# Patient Record
Sex: Male | Born: 1950 | Race: White | Hispanic: No | Marital: Married | Smoking: Former smoker
Health system: Southern US, Community
[De-identification: ages and names within clinical notes are randomized; demographics above are authoritative.]

## PROBLEM LIST (undated history)

## (undated) DIAGNOSIS — F419 Anxiety disorder, unspecified: Secondary | ICD-10-CM

## (undated) DIAGNOSIS — I2510 Atherosclerotic heart disease of native coronary artery without angina pectoris: Secondary | ICD-10-CM

## (undated) DIAGNOSIS — Z950 Presence of cardiac pacemaker: Secondary | ICD-10-CM

## (undated) DIAGNOSIS — K219 Gastro-esophageal reflux disease without esophagitis: Secondary | ICD-10-CM

## (undated) DIAGNOSIS — R7303 Prediabetes: Secondary | ICD-10-CM

## (undated) DIAGNOSIS — J45909 Unspecified asthma, uncomplicated: Secondary | ICD-10-CM

## (undated) DIAGNOSIS — C801 Malignant (primary) neoplasm, unspecified: Secondary | ICD-10-CM

## (undated) DIAGNOSIS — F32A Depression, unspecified: Secondary | ICD-10-CM

## (undated) DIAGNOSIS — I10 Essential (primary) hypertension: Secondary | ICD-10-CM

## (undated) DIAGNOSIS — R011 Cardiac murmur, unspecified: Secondary | ICD-10-CM

## (undated) DIAGNOSIS — I38 Endocarditis, valve unspecified: Secondary | ICD-10-CM

## (undated) DIAGNOSIS — I719 Aortic aneurysm of unspecified site, without rupture: Secondary | ICD-10-CM

## (undated) DIAGNOSIS — M1990 Unspecified osteoarthritis, unspecified site: Secondary | ICD-10-CM

## (undated) HISTORY — PX: VASECTOMY: SHX75

## (undated) HISTORY — PX: BLADDER SUSPENSION: SHX72

## (undated) HISTORY — PX: CORONARY ARTERY BYPASS GRAFT: SHX141

## (undated) HISTORY — PX: COLONOSCOPY: SHX174

## (undated) HISTORY — PX: AORTIC VALVE REPLACEMENT: SHX41

## (undated) HISTORY — PX: SPINAL FUSION: SHX223

## (undated) HISTORY — PX: CIRCUMCISION: SUR203

## (undated) HISTORY — PX: MEDIASTERNOTOMY: SHX5084

## (undated) HISTORY — PX: CARDIAC CATHETERIZATION: SHX172

## (undated) HISTORY — PX: CARDIAC SURGERY: SHX584

## (undated) HISTORY — PX: MULTIPLE TOOTH EXTRACTIONS: SHX2053

## (undated) HISTORY — PX: KNEE ARTHROSCOPY: SUR90

## (undated) HISTORY — PX: TONSILLECTOMY: SUR1361

---

## 2001-09-06 DIAGNOSIS — C801 Malignant (primary) neoplasm, unspecified: Secondary | ICD-10-CM

## 2001-09-06 HISTORY — PX: PROSTATECTOMY: SHX69

## 2001-09-06 HISTORY — DX: Malignant (primary) neoplasm, unspecified: C80.1

## 2006-06-29 DIAGNOSIS — Q2543 Congenital aneurysm of aorta: Secondary | ICD-10-CM | POA: Insufficient documentation

## 2010-11-05 DIAGNOSIS — I38 Endocarditis, valve unspecified: Secondary | ICD-10-CM

## 2010-11-05 HISTORY — DX: Endocarditis, valve unspecified: I38

## 2013-02-12 DIAGNOSIS — I10 Essential (primary) hypertension: Secondary | ICD-10-CM | POA: Insufficient documentation

## 2013-07-23 HISTORY — PX: URETHRAL SLING: SHX2621

## 2013-10-17 DIAGNOSIS — C61 Malignant neoplasm of prostate: Secondary | ICD-10-CM | POA: Insufficient documentation

## 2014-11-04 DIAGNOSIS — E663 Overweight: Secondary | ICD-10-CM | POA: Insufficient documentation

## 2014-11-04 DIAGNOSIS — M25551 Pain in right hip: Secondary | ICD-10-CM | POA: Insufficient documentation

## 2014-11-07 DIAGNOSIS — G720 Drug-induced myopathy: Secondary | ICD-10-CM | POA: Insufficient documentation

## 2016-02-10 DIAGNOSIS — R42 Dizziness and giddiness: Secondary | ICD-10-CM | POA: Insufficient documentation

## 2016-07-04 ENCOUNTER — Encounter: Payer: Self-pay | Admitting: Emergency Medicine

## 2016-07-04 ENCOUNTER — Emergency Department
Admission: EM | Admit: 2016-07-04 | Discharge: 2016-07-04 | Disposition: A | Discharge status: Home / Self Care | Payer: BLUE CROSS/BLUE SHIELD | Source: Home / Self Care | Attending: Family Medicine | Admitting: Family Medicine

## 2016-07-04 DIAGNOSIS — R319 Hematuria, unspecified: Secondary | ICD-10-CM

## 2016-07-04 DIAGNOSIS — N390 Urinary tract infection, site not specified: Secondary | ICD-10-CM

## 2016-07-04 HISTORY — DX: Atherosclerotic heart disease of native coronary artery without angina pectoris: I25.10

## 2016-07-04 HISTORY — DX: Malignant (primary) neoplasm, unspecified: C80.1

## 2016-07-04 HISTORY — DX: Essential (primary) hypertension: I10

## 2016-07-04 LAB — POCT URINALYSIS DIP (MANUAL ENTRY)
BILIRUBIN UA: NEGATIVE
Bilirubin, UA: NEGATIVE
Glucose, UA: NEGATIVE
NITRITE UA: NEGATIVE
PH UA: 6 (ref 5–8)
Spec Grav, UA: 1.015 (ref 1.005–1.03)
UROBILINOGEN UA: 0.2 (ref 0–1)

## 2016-07-04 MED ORDER — CIPROFLOXACIN HCL 500 MG PO TABS: 500.0000 mg | ORAL_TABLET | Freq: Two times a day (BID) | ORAL | 0 refills | Status: DC

## 2016-07-04 NOTE — ED Provider Notes (Signed)
[facility]    CSN: [id number] Arrival date & time: 07/04/16  1705     History   Chief Complaint Chief Complaint  Patient presents with  . Dysuria  . Hematuria    HPI Marcus Harper is a 65 y.o. male.   Patient complains of onset of dysuria and hematuria yesterday.  No abdominal, back, or flank pain.  No fevers, chills, and sweats.  No testicular pain.  No urethral discharge. He has a history of prostate CA.   The history is provided by the patient.  Dysuria  This is a new problem. The current episode started yesterday. The problem occurs constantly. The problem has been gradually worsening. Pertinent negatives include no abdominal pain. Nothing aggravates the symptoms. Nothing relieves the symptoms. He has tried nothing for the symptoms.  Hematuria  Pertinent negatives include no abdominal pain.    Past Medical History:  Diagnosis Date  . Cancer [facility]    prostate  . Coronary artery disease    aortic valve  . Hypertension     There are no active problems to display for this patient.   Past Surgical History:  Procedure Laterality Date  . CARDIAC SURGERY    . PROSTATECTOMY         Home Medications    Prior to Admission medications   Medication Sig Start Date End Date Taking? Authorizing Provider  amLODipine (NORVASC) 5 MG tablet Take 5 mg by mouth daily.   Yes Historical Provider, MD  aspirin 325 MG EC tablet Take 325 mg by mouth daily.   Yes Historical Provider, MD  atorvastatin (LIPITOR) 40 MG tablet Take 40 mg by mouth daily.   Yes Historical Provider, MD  hydrochlorothiazide (HYDRODIURIL) 25 MG tablet Take 25 mg by mouth daily.   Yes Historical Provider, MD  lisinopril (PRINIVIL,ZESTRIL) 20 MG tablet Take 20 mg by mouth daily.   Yes Historical Provider, MD  metoprolol succinate (TOPROL-XL) 25 MG 24 hr tablet Take 25 mg by mouth daily.   Yes Historical Provider, MD  oxybutynin (DITROPAN-XL) 10 MG 24 hr tablet Take 10 mg by mouth at bedtime.    Yes Historical Provider, MD  ciprofloxacin (CIPRO) 500 MG tablet Take 1 tablet (500 mg total) by mouth 2 (two) times daily. 07/04/16   Kandra Nicolas, MD    Family History Family History  Problem Relation Age of Onset  . Heart failure Mother   . Cancer Father     Social History Social History  Substance Use Topics  . Smoking status: Former Research scientist (life sciences)  . Smokeless tobacco: Not on file  . Alcohol use Yes     Allergies   Penicillins   Review of Systems Review of Systems  Constitutional: Negative for activity change, appetite change, chills, diaphoresis, fatigue and fever.  Gastrointestinal: Negative for abdominal pain.  Genitourinary: Positive for dysuria and hematuria.  All other systems reviewed and are negative.    Physical Exam Triage Vital Signs ED Triage Vitals  Enc Vitals Group     BP 07/04/16 1743 131/66     Pulse Rate 07/04/16 1743 (!) 55     Resp 07/04/16 1743 16     Temp 07/04/16 1743 98.4 F (36.9 C)     Temp Source 07/04/16 1743 Oral     SpO2 07/04/16 1743 97 %     Weight 07/04/16 1744 180 lb (81.6 kg)     Height 07/04/16 1744 5' 9.5" (1.765 m)     Head Circumference --  Peak Flow --      Pain Score 07/04/16 1749 3     Pain Loc --      Pain Edu? --      Excl. in [place]? --    No data found.   Updated Vital Signs BP 131/66 (BP Location: Left Arm)   Pulse (!) 55   Temp 98.4 F (36.9 C) (Oral)   Resp 16   Ht 5' 9.5" (1.765 m)   Wt 180 lb (81.6 kg)   SpO2 97%   BMI 26.20 kg/m   Visual Acuity Right Eye Distance:   Left Eye Distance:   Bilateral Distance:    Right Eye Near:   Left Eye Near:    Bilateral Near:     Physical Exam Nursing notes and Vital Signs reviewed. Appearance:  Patient appears stated age, and in no acute distress.    Eyes:  Pupils are equal, round, and reactive to light and accomodation.  Extraocular movement is intact.  Conjunctivae are not inflamed   Pharynx:  Normal; moist mucous membranes  Neck:  Supple.  No  adenopathy Lungs:  Clear to auscultation.  Breath sounds are equal.  Moving air well. Heart:  Regular rate and rhythm without murmurs, rubs, or gallops.  Abdomen:  Nontender without masses or hepatosplenomegaly.  Bowel sounds are present.  No CVA or flank tenderness.  Extremities:  No edema.  Skin:  No rash present.     UC Treatments / Results  Labs (all labs ordered are listed, but only abnormal results are displayed) Labs Reviewed  POCT URINALYSIS DIP (MANUAL ENTRY) - Abnormal; Notable for the following:       Result Value   Clarity, UA hazy (*)    Blood, UA large (*)    Protein Ur, POC trace (*)    Leukocytes, UA small (1+) (*)    All other components within normal limits  URINE CULTURE    EKG  EKG Interpretation None       Radiology No results found.  Procedures Procedures (including critical care time)  Medications Ordered in UC Medications - No data to display   Initial Impression / Assessment and Plan / UC Course  I have reviewed the triage vital signs and the nursing notes.  Pertinent labs & imaging results that were available during my care of the patient were reviewed by me and considered in my medical decision making (see chart for details).  Clinical Course  Urine culture pending. Begin Cipro 500mg  BID for 10 days.  (stop Lipitor while taking Cipro) Increase fluid intake. If symptoms become significantly worse during the night or over the weekend, proceed to the local emergency room.  Followup with Family Doctor if not improved in about 4 days.     Final Clinical Impressions(s) / UC Diagnoses   Final diagnoses:  Urinary tract infection with hematuria, site unspecified    New Prescriptions New Prescriptions   CIPROFLOXACIN (CIPRO) 500 MG TABLET    Take 1 tablet (500 mg total) by mouth 2 (two) times daily.     Kandra Nicolas, MD 07/11/16 [phone]

## 2016-07-04 NOTE — ED Triage Notes (Signed)
Reports dysuria and hematuria since yesterday.

## 2016-07-04 NOTE — Discharge Instructions (Signed)
Increase fluid intake. °If symptoms become significantly worse during the night or over the weekend, proceed to the local emergency room.  °

## 2016-07-06 ENCOUNTER — Telehealth: Payer: Self-pay | Admitting: Emergency Medicine

## 2016-07-06 LAB — URINE CULTURE

## 2016-07-06 NOTE — Telephone Encounter (Signed)
Feeling better, pos for e-coli

## 2017-09-23 DIAGNOSIS — I350 Nonrheumatic aortic (valve) stenosis: Secondary | ICD-10-CM | POA: Insufficient documentation

## 2017-10-26 DIAGNOSIS — Z952 Presence of prosthetic heart valve: Secondary | ICD-10-CM | POA: Insufficient documentation

## 2017-11-02 HISTORY — PX: PACEMAKER INSERTION: SHX728

## 2017-11-03 DIAGNOSIS — Z950 Presence of cardiac pacemaker: Secondary | ICD-10-CM | POA: Insufficient documentation

## 2020-07-18 ENCOUNTER — Other Ambulatory Visit (HOSPITAL_COMMUNITY): Payer: Self-pay | Admitting: Orthopedic Surgery

## 2020-07-18 ENCOUNTER — Other Ambulatory Visit: Payer: Self-pay | Admitting: Orthopedic Surgery

## 2020-07-21 ENCOUNTER — Other Ambulatory Visit (HOSPITAL_COMMUNITY): Payer: Self-pay | Admitting: Orthopedic Surgery

## 2020-07-21 ENCOUNTER — Other Ambulatory Visit: Payer: Self-pay | Admitting: Orthopedic Surgery

## 2020-07-21 DIAGNOSIS — M545 Low back pain, unspecified: Secondary | ICD-10-CM

## 2020-07-21 DIAGNOSIS — M5410 Radiculopathy, site unspecified: Secondary | ICD-10-CM

## 2020-07-25 ENCOUNTER — Other Ambulatory Visit (HOSPITAL_COMMUNITY): Payer: Self-pay | Admitting: Orthopedic Surgery

## 2020-07-25 DIAGNOSIS — M545 Low back pain, unspecified: Secondary | ICD-10-CM

## 2020-08-05 ENCOUNTER — Ambulatory Visit (HOSPITAL_COMMUNITY)
Admission: RE | Admit: 2020-08-05 | Discharge: 2020-08-05 | Disposition: A | Discharge status: Home / Self Care | Payer: BC Managed Care – PPO | Source: Ambulatory Visit | Attending: Physician Assistant | Admitting: Physician Assistant

## 2020-08-05 ENCOUNTER — Ambulatory Visit (HOSPITAL_COMMUNITY)
Admission: RE | Admit: 2020-08-05 | Discharge: 2020-08-05 | Disposition: A | Discharge status: Home / Self Care | Payer: BC Managed Care – PPO | Source: Ambulatory Visit | Attending: Orthopedic Surgery | Admitting: Orthopedic Surgery

## 2020-08-05 ENCOUNTER — Other Ambulatory Visit: Payer: Self-pay

## 2020-08-05 DIAGNOSIS — M545 Low back pain, unspecified: Secondary | ICD-10-CM | POA: Insufficient documentation

## 2020-08-05 DIAGNOSIS — Z950 Presence of cardiac pacemaker: Secondary | ICD-10-CM | POA: Insufficient documentation

## 2020-08-05 NOTE — Progress Notes (Signed)
Patient here today for MRI lumbar spine with pacemaker. [facility] express sent to [facility]. Orders received for DOO rate of 95. Will re-program once scan is completed.

## 2021-02-18 ENCOUNTER — Other Ambulatory Visit: Payer: Self-pay | Admitting: Student

## 2021-02-18 DIAGNOSIS — M5416 Radiculopathy, lumbar region: Secondary | ICD-10-CM

## 2021-02-19 ENCOUNTER — Telehealth: Payer: Self-pay

## 2021-02-19 NOTE — Telephone Encounter (Addendum)
Spoke with patient for [place] to screen his medications before getting him scheduled for a lumbar myelogram.  He stated an understanding that he will be here two hours and will need a driver.  He states an understanding to hold his aspirin for three days befor this procedure.

## 2021-02-27 ENCOUNTER — Ambulatory Visit
Admission: RE | Admit: 2021-02-27 | Discharge: 2021-02-27 | Disposition: A | Discharge status: Home / Self Care | Payer: BC Managed Care – PPO | Source: Ambulatory Visit | Attending: Student | Admitting: Student

## 2021-02-27 DIAGNOSIS — M5416 Radiculopathy, lumbar region: Secondary | ICD-10-CM

## 2021-02-27 MED ORDER — IOPAMIDOL (ISOVUE-M 200) INJECTION 41%
15.0000 mL | Freq: Once | INTRAMUSCULAR | Status: AC
  Administered 2021-02-27: 15 mL via INTRATHECAL

## 2021-02-27 MED ORDER — DIAZEPAM 5 MG PO TABS
5.0000 mg | ORAL_TABLET | Freq: Once | ORAL | Status: AC
  Administered 2021-02-27: 5 mg via ORAL

## 2021-02-27 NOTE — Discharge Instructions (Signed)
Myelogram Discharge Instructions  Go home and rest quietly as needed. You may resume normal activities; however, do not exert yourself strongly or do any heavy lifting today and tomorrow.   DO NOT drive today.    You may resume your normal diet and medications unless otherwise indicated. Drink lots of extra fluids today and tomorrow.   The incidence of headache, nausea, or vomiting is about 5% (one in 20 patients).  If you develop a headache, lie flat for 24 hours and drink plenty of fluids until the headache goes away.  Caffeinated beverages may be helpful. If when you get up you still have a headache when standing, go back to bed and force fluids for another 24 hours.   If you develop severe nausea and vomiting or a headache that does not go away with the flat bedrest after 48 hours, please call [phone].   Call your physician for a follow-up appointment.  The results of your myelogram will be sent directly to your physician by the following day.  If you have any questions or if complications develop after you arrive home, please call [phone].  Discharge instructions have been explained to the patient.  The patient, or the person responsible for the patient, fully understands these instructions.   Thank you for visiting our office today.    YOU MAY RESUME YOUR ASPIRIN ANYTIME TODAY AFTER PROCEDURE.

## 2021-04-17 ENCOUNTER — Other Ambulatory Visit: Payer: Self-pay

## 2021-04-17 ENCOUNTER — Encounter (HOSPITAL_COMMUNITY): Payer: Self-pay | Admitting: Neurological Surgery

## 2021-04-17 NOTE — Progress Notes (Addendum)
Mr. Dicker denies chest opian or shortness of breath. Patient denies having any s/s of Covid in his household.  Patient denies any known exposure to Covid.  Mr. Weishaupt will be tested on arrival for Covid. Mr. Paller and wife were on the line, Mr. Fazzini was anxious because of the late date of the call.  I instructed patient to shower with antibiotic soap, if it is available.  Dry off with a clean towel. Do not put lotion, powder, cologne or deodorant or makeup.No jewelry or piercings. Men may shave their face and neck. Woman should not shave. No nail polish, artificial or acrylic nails. Wear clean clothes, brush your teeth. Glasses, contact lens,dentures or partials may not be worn in the OR. If you need to wear them, please bring a case for glasses, do not wear contacts or bring a case, the hospital does not have contact cases, dentures or partials will have to be removed , make sure they are clean, we will provide a denture cup to put them in. You will need some one to drive you home and a responsible person over the age of 17 to stay with you for the first 24 hours after surgery.   Mr. Curless has a pacemaker - it is a Medtronic , patient is seen at [facility] location, I faxed a request for EKG and Peri OP Device orders.

## 2021-04-20 ENCOUNTER — Encounter (HOSPITAL_COMMUNITY): Payer: Self-pay | Admitting: Neurological Surgery

## 2021-04-20 ENCOUNTER — Ambulatory Visit (HOSPITAL_COMMUNITY): Payer: BC Managed Care – PPO | Admitting: Anesthesiology

## 2021-04-20 ENCOUNTER — Encounter (HOSPITAL_COMMUNITY)
Admission: RE | Disposition: A | Discharge status: Home / Self Care | Payer: Self-pay | Source: Home / Self Care | Attending: Neurological Surgery

## 2021-04-20 ENCOUNTER — Other Ambulatory Visit: Payer: Self-pay

## 2021-04-20 ENCOUNTER — Ambulatory Visit (HOSPITAL_COMMUNITY): Payer: BC Managed Care – PPO

## 2021-04-20 ENCOUNTER — Ambulatory Visit (HOSPITAL_COMMUNITY)
Admission: RE | Admit: 2021-04-20 | Discharge: 2021-04-20 | Disposition: A | Discharge status: Home / Self Care | Payer: BC Managed Care – PPO | Attending: Neurological Surgery | Admitting: Neurological Surgery

## 2021-04-20 DIAGNOSIS — Z950 Presence of cardiac pacemaker: Secondary | ICD-10-CM | POA: Insufficient documentation

## 2021-04-20 DIAGNOSIS — Z79899 Other long term (current) drug therapy: Secondary | ICD-10-CM | POA: Diagnosis not present

## 2021-04-20 DIAGNOSIS — M48061 Spinal stenosis, lumbar region without neurogenic claudication: Secondary | ICD-10-CM | POA: Diagnosis not present

## 2021-04-20 DIAGNOSIS — Z20822 Contact with and (suspected) exposure to covid-19: Secondary | ICD-10-CM | POA: Insufficient documentation

## 2021-04-20 DIAGNOSIS — Z880 Allergy status to penicillin: Secondary | ICD-10-CM | POA: Diagnosis not present

## 2021-04-20 DIAGNOSIS — M4807 Spinal stenosis, lumbosacral region: Secondary | ICD-10-CM | POA: Insufficient documentation

## 2021-04-20 DIAGNOSIS — Z952 Presence of prosthetic heart valve: Secondary | ICD-10-CM | POA: Insufficient documentation

## 2021-04-20 DIAGNOSIS — M2578 Osteophyte, vertebrae: Secondary | ICD-10-CM | POA: Insufficient documentation

## 2021-04-20 DIAGNOSIS — M5417 Radiculopathy, lumbosacral region: Secondary | ICD-10-CM | POA: Diagnosis not present

## 2021-04-20 DIAGNOSIS — Z8546 Personal history of malignant neoplasm of prostate: Secondary | ICD-10-CM | POA: Insufficient documentation

## 2021-04-20 DIAGNOSIS — Z8249 Family history of ischemic heart disease and other diseases of the circulatory system: Secondary | ICD-10-CM | POA: Insufficient documentation

## 2021-04-20 DIAGNOSIS — Z419 Encounter for procedure for purposes other than remedying health state, unspecified: Secondary | ICD-10-CM

## 2021-04-20 HISTORY — PX: LUMBAR LAMINECTOMY/DECOMPRESSION MICRODISCECTOMY: SHX5026

## 2021-04-20 HISTORY — DX: Anxiety disorder, unspecified: F41.9

## 2021-04-20 HISTORY — DX: Gastro-esophageal reflux disease without esophagitis: K21.9

## 2021-04-20 HISTORY — DX: Unspecified asthma, uncomplicated: J45.909

## 2021-04-20 HISTORY — DX: Presence of cardiac pacemaker: Z95.0

## 2021-04-20 HISTORY — DX: Unspecified osteoarthritis, unspecified site: M19.90

## 2021-04-20 LAB — COMPREHENSIVE METABOLIC PANEL
ALT: 61 U/L — ABNORMAL HIGH (ref 0–44)
AST: 28 U/L (ref 15–41)
Albumin: 3.8 g/dL (ref 3.5–5.0)
Alkaline Phosphatase: 61 U/L (ref 38–126)
Anion gap: 7 (ref 5–15)
BUN: 16 mg/dL (ref 8–23)
CO2: 28 mmol/L (ref 22–32)
Calcium: 9.3 mg/dL (ref 8.9–10.3)
Chloride: 98 mmol/L (ref 98–111)
Creatinine, Ser: 0.8 mg/dL (ref 0.61–1.24)
GFR, Estimated: >60 mL/min (ref >60)
Glucose, Bld: 114 mg/dL — ABNORMAL HIGH (ref 70–99)
Potassium: 4 mmol/L (ref 3.5–5.1)
Sodium: 133 mmol/L — ABNORMAL LOW (ref 135–145)
Total Bilirubin: 0.8 mg/dL (ref 0.3–1.2)
Total Protein: 6.3 g/dL — ABNORMAL LOW (ref 6.5–8.1)

## 2021-04-20 LAB — CBC
HCT: 36.5 % — ABNORMAL LOW (ref 39.0–52.0)
Hemoglobin: 12.7 g/dL — ABNORMAL LOW (ref 13.0–17.0)
MCH: 32.2 pg (ref 26.0–34.0)
MCHC: 34.8 g/dL (ref 30.0–36.0)
MCV: 92.4 fL (ref 80.0–100.0)
Platelets: 202 10*3/uL (ref 150–400)
RBC: 3.95 MIL/uL — ABNORMAL LOW (ref 4.22–5.81)
RDW: 13 % (ref 11.5–15.5)
WBC: 9.9 10*3/uL (ref 4.0–10.5)
nRBC: 0 % (ref 0.0–0.2)

## 2021-04-20 LAB — SARS CORONAVIRUS 2 BY RT PCR (HOSPITAL ORDER, PERFORMED IN ~~LOC~~ HOSPITAL LAB): SARS Coronavirus 2: NEGATIVE

## 2021-04-20 LAB — SURGICAL PCR SCREEN
MRSA, PCR: NEGATIVE
Staphylococcus aureus: NEGATIVE

## 2021-04-20 SURGERY — LUMBAR LAMINECTOMY/DECOMPRESSION MICRODISCECTOMY 1 LEVEL
Anesthesia: General | Site: Spine Lumbar | Laterality: Left

## 2021-04-20 MED ORDER — FENTANYL CITRATE (PF) 250 MCG/5ML IJ SOLN
INTRAMUSCULAR | Status: AC
  Filled 2021-04-20: qty 5

## 2021-04-20 MED ORDER — PHENYLEPHRINE HCL-NACL 20-0.9 MG/250ML-% IV SOLN
INTRAVENOUS | Status: DC | PRN
  Administered 2021-04-20: 20 ug/min via INTRAVENOUS

## 2021-04-20 MED ORDER — 0.9 % SODIUM CHLORIDE (POUR BTL) OPTIME
TOPICAL | Status: DC | PRN
  Administered 2021-04-20: 1000 mL

## 2021-04-20 MED ORDER — VANCOMYCIN HCL IN DEXTROSE 1-5 GM/200ML-% IV SOLN
1000.0000 mg | Freq: Once | INTRAVENOUS | Status: AC
  Administered 2021-04-20: 1000 mg via INTRAVENOUS
  Filled 2021-04-20: qty 200

## 2021-04-20 MED ORDER — ORAL CARE MOUTH RINSE: 15.0000 mL | Freq: Once | OROMUCOSAL | Status: AC

## 2021-04-20 MED ORDER — KETOROLAC TROMETHAMINE 30 MG/ML IJ SOLN
INTRAMUSCULAR | Status: AC
  Filled 2021-04-20: qty 1

## 2021-04-20 MED ORDER — ACETAMINOPHEN 10 MG/ML IV SOLN: 1000.0000 mg | Freq: Once | INTRAVENOUS | Status: DC | PRN

## 2021-04-20 MED ORDER — CHLORHEXIDINE GLUCONATE 0.12 % MT SOLN
OROMUCOSAL | Status: AC
  Administered 2021-04-20: 15 mL via OROMUCOSAL
  Filled 2021-04-20: qty 15

## 2021-04-20 MED ORDER — MIDAZOLAM HCL 2 MG/2ML IJ SOLN
INTRAMUSCULAR | Status: AC
  Filled 2021-04-20: qty 2

## 2021-04-20 MED ORDER — BUPIVACAINE HCL (PF) 0.25 % IJ SOLN
INTRAMUSCULAR | Status: DC | PRN
  Administered 2021-04-20: 10 mL
  Administered 2021-04-20: 4 mL

## 2021-04-20 MED ORDER — FENTANYL CITRATE (PF) 250 MCG/5ML IJ SOLN
INTRAMUSCULAR | Status: DC | PRN
  Administered 2021-04-20: 150 ug via INTRAVENOUS

## 2021-04-20 MED ORDER — LACTATED RINGERS IV SOLN: INTRAVENOUS | Status: DC

## 2021-04-20 MED ORDER — PROPOFOL 10 MG/ML IV BOLUS
INTRAVENOUS | Status: DC | PRN
  Administered 2021-04-20: 150 mg via INTRAVENOUS

## 2021-04-20 MED ORDER — ACETAMINOPHEN 325 MG PO TABS: 325.0000 mg | ORAL_TABLET | Freq: Once | ORAL | Status: DC | PRN

## 2021-04-20 MED ORDER — HYDROMORPHONE HCL 1 MG/ML IJ SOLN
0.2500 mg | INTRAMUSCULAR | Status: DC | PRN
  Administered 2021-04-20: 0.25 mg via INTRAVENOUS
  Administered 2021-04-20: 0.5 mg via INTRAVENOUS
  Administered 2021-04-20: 0.25 mg via INTRAVENOUS

## 2021-04-20 MED ORDER — HYDROMORPHONE HCL 1 MG/ML IJ SOLN
INTRAMUSCULAR | Status: AC
  Filled 2021-04-20: qty 1

## 2021-04-20 MED ORDER — ONDANSETRON HCL 4 MG/2ML IJ SOLN
INTRAMUSCULAR | Status: DC | PRN
  Administered 2021-04-20: 4 mg via INTRAVENOUS

## 2021-04-20 MED ORDER — ROCURONIUM BROMIDE 10 MG/ML (PF) SYRINGE
PREFILLED_SYRINGE | INTRAVENOUS | Status: AC
  Filled 2021-04-20: qty 10

## 2021-04-20 MED ORDER — CHLORHEXIDINE GLUCONATE 0.12 % MT SOLN
15.0000 mL | Freq: Once | OROMUCOSAL | Status: AC
  Filled 2021-04-20: qty 15

## 2021-04-20 MED ORDER — KETOROLAC TROMETHAMINE 15 MG/ML IJ SOLN
INTRAMUSCULAR | Status: DC | PRN
  Administered 2021-04-20: 15 mg via INTRAVENOUS

## 2021-04-20 MED ORDER — ACETAMINOPHEN 10 MG/ML IV SOLN
INTRAVENOUS | Status: DC | PRN
  Administered 2021-04-20: 1000 mg via INTRAVENOUS

## 2021-04-20 MED ORDER — DEXAMETHASONE SODIUM PHOSPHATE 10 MG/ML IJ SOLN
INTRAMUSCULAR | Status: AC
  Filled 2021-04-20: qty 1

## 2021-04-20 MED ORDER — LIDOCAINE 2% (20 MG/ML) 5 ML SYRINGE
INTRAMUSCULAR | Status: AC
  Filled 2021-04-20: qty 5

## 2021-04-20 MED ORDER — MEPERIDINE HCL 25 MG/ML IJ SOLN: 6.2500 mg | INTRAMUSCULAR | Status: DC | PRN

## 2021-04-20 MED ORDER — ACETAMINOPHEN 160 MG/5ML PO SOLN: 325.0000 mg | Freq: Once | ORAL | Status: DC | PRN

## 2021-04-20 MED ORDER — SUGAMMADEX SODIUM 200 MG/2ML IV SOLN
INTRAVENOUS | Status: DC | PRN
  Administered 2021-04-20: 180 mg via INTRAVENOUS

## 2021-04-20 MED ORDER — ONDANSETRON HCL 4 MG/2ML IJ SOLN
INTRAMUSCULAR | Status: AC
  Filled 2021-04-20: qty 2

## 2021-04-20 MED ORDER — AMISULPRIDE (ANTIEMETIC) 5 MG/2ML IV SOLN: 10.0000 mg | Freq: Once | INTRAVENOUS | Status: DC | PRN

## 2021-04-20 MED ORDER — LIDOCAINE 2% (20 MG/ML) 5 ML SYRINGE
INTRAMUSCULAR | Status: DC | PRN
  Administered 2021-04-20: 60 mg via INTRAVENOUS

## 2021-04-20 MED ORDER — PHENYLEPHRINE 40 MCG/ML (10ML) SYRINGE FOR IV PUSH (FOR BLOOD PRESSURE SUPPORT)
PREFILLED_SYRINGE | INTRAVENOUS | Status: DC | PRN
  Administered 2021-04-20 (×4): 80 ug via INTRAVENOUS

## 2021-04-20 MED ORDER — PROMETHAZINE HCL 25 MG/ML IJ SOLN: 6.2500 mg | INTRAMUSCULAR | Status: DC | PRN

## 2021-04-20 MED ORDER — OXYCODONE-ACETAMINOPHEN 10-325 MG PO TABS: 1.0000 | ORAL_TABLET | ORAL | 0 refills | Status: DC | PRN

## 2021-04-20 MED ORDER — BUPIVACAINE HCL (PF) 0.25 % IJ SOLN
INTRAMUSCULAR | Status: AC
  Filled 2021-04-20: qty 30

## 2021-04-20 MED ORDER — PHENYLEPHRINE 40 MCG/ML (10ML) SYRINGE FOR IV PUSH (FOR BLOOD PRESSURE SUPPORT)
PREFILLED_SYRINGE | INTRAVENOUS | Status: AC
  Filled 2021-04-20: qty 10

## 2021-04-20 MED ORDER — THROMBIN 5000 UNITS EX SOLR
OROMUCOSAL | Status: DC | PRN
  Administered 2021-04-20: 5 mL via TOPICAL

## 2021-04-20 MED ORDER — ACETAMINOPHEN 10 MG/ML IV SOLN
INTRAVENOUS | Status: AC
  Filled 2021-04-20: qty 100

## 2021-04-20 MED ORDER — THROMBIN 5000 UNITS EX SOLR
CUTANEOUS | Status: AC
  Filled 2021-04-20: qty 5000

## 2021-04-20 MED ORDER — ROCURONIUM BROMIDE 10 MG/ML (PF) SYRINGE
PREFILLED_SYRINGE | INTRAVENOUS | Status: DC | PRN
Start: 2021-04-20 — End: 2021-04-20
  Administered 2021-04-20: 60 mg via INTRAVENOUS
  Administered 2021-04-20: 20 mg via INTRAVENOUS

## 2021-04-20 MED ORDER — DEXAMETHASONE SODIUM PHOSPHATE 10 MG/ML IJ SOLN
INTRAMUSCULAR | Status: DC | PRN
Start: 2021-04-20 — End: 2021-04-20
  Administered 2021-04-20: 10 mg via INTRAVENOUS

## 2021-04-20 SURGICAL SUPPLY — 37 items
BAG COUNTER SPONGE SURGICOUNT (BAG) ×2 IMPLANT
BAND RUBBER #18 3X1/16 STRL (MISCELLANEOUS) ×4 IMPLANT
BENZOIN TINCTURE PRP APPL 2/3 (GAUZE/BANDAGES/DRESSINGS) ×2 IMPLANT
BUR CARBIDE MATCH 3.0 (BURR) ×2 IMPLANT
CANISTER SUCT 3000ML PPV (MISCELLANEOUS) ×2 IMPLANT
DRAPE LAPAROTOMY 100X72X124 (DRAPES) ×2 IMPLANT
DRAPE MICROSCOPE LEICA (MISCELLANEOUS) ×2 IMPLANT
DRAPE SURG 17X23 STRL (DRAPES) ×2 IMPLANT
DRSG OPSITE POSTOP 4X6 (GAUZE/BANDAGES/DRESSINGS) ×2 IMPLANT
DURAPREP 26ML APPLICATOR (WOUND CARE) ×2 IMPLANT
ELECT REM PT RETURN 9FT ADLT (ELECTROSURGICAL) ×2
ELECTRODE REM PT RTRN 9FT ADLT (ELECTROSURGICAL) ×1 IMPLANT
GAUZE 4X4 16PLY ~~LOC~~+RFID DBL (SPONGE) IMPLANT
GLOVE SURG ENC MOIS LTX SZ7 (GLOVE) IMPLANT
GLOVE SURG ENC MOIS LTX SZ8 (GLOVE) ×2 IMPLANT
GLOVE SURG UNDER POLY LF SZ7 (GLOVE) IMPLANT
GOWN STRL REUS W/ TWL LRG LVL3 (GOWN DISPOSABLE) IMPLANT
GOWN STRL REUS W/ TWL XL LVL3 (GOWN DISPOSABLE) ×1 IMPLANT
GOWN STRL REUS W/TWL 2XL LVL3 (GOWN DISPOSABLE) IMPLANT
GOWN STRL REUS W/TWL LRG LVL3 (GOWN DISPOSABLE)
GOWN STRL REUS W/TWL XL LVL3 (GOWN DISPOSABLE) ×1
HEMOSTAT POWDER KIT SURGIFOAM (HEMOSTASIS) ×2 IMPLANT
KIT BASIN OR (CUSTOM PROCEDURE TRAY) ×2 IMPLANT
KIT TURNOVER KIT B (KITS) ×2 IMPLANT
NEEDLE HYPO 25X1 1.5 SAFETY (NEEDLE) ×2 IMPLANT
NEEDLE SPNL 20GX3.5 QUINCKE YW (NEEDLE) ×2 IMPLANT
NS IRRIG 1000ML POUR BTL (IV SOLUTION) ×2 IMPLANT
PACK LAMINECTOMY NEURO (CUSTOM PROCEDURE TRAY) ×2 IMPLANT
PAD ARMBOARD 7.5X6 YLW CONV (MISCELLANEOUS) ×6 IMPLANT
STRIP CLOSURE SKIN 1/2X4 (GAUZE/BANDAGES/DRESSINGS) ×2 IMPLANT
SUT VIC AB 0 CT1 18XCR BRD8 (SUTURE) ×1 IMPLANT
SUT VIC AB 0 CT1 8-18 (SUTURE) ×1
SUT VIC AB 2-0 CP2 18 (SUTURE) ×2 IMPLANT
SUT VIC AB 3-0 SH 8-18 (SUTURE) ×2 IMPLANT
TOWEL GREEN STERILE (TOWEL DISPOSABLE) ×2 IMPLANT
TOWEL GREEN STERILE FF (TOWEL DISPOSABLE) ×2 IMPLANT
WATER STERILE IRR 1000ML POUR (IV SOLUTION) ×2 IMPLANT

## 2021-04-20 NOTE — Transfer of Care (Signed)
Immediate Anesthesia Transfer of Care Note  Patient: Marcus Harper  Procedure(s) Performed: Laminectomy and Foraminotomy - Lumbar five-sacral one - left, Lumbar Lumbar four- five extraforaminal decompression (Left: Spine Lumbar)  Patient Location: PACU  Anesthesia Type:General  Level of Consciousness: awake and alert   Airway & Oxygen Therapy: Patient Spontanous Breathing and Patient connected to nasal cannula oxygen  Post-op Assessment: Report given to RN  Post vital signs: Reviewed and stable  Last Vitals:  Vitals Value Taken Time  BP 124/67 04/20/21 1815  Temp    Pulse 69 04/20/21 1816  Resp 19 04/20/21 1816  SpO2 98 % 04/20/21 1816  Vitals shown include unvalidated device data.  Last Pain:  Vitals:   04/20/21 1053  TempSrc:   PainSc: 8       Patients Stated Pain Goal: 2 ([id number])  Complications: No notable events documented.

## 2021-04-20 NOTE — Discharge Summary (Signed)
Physician Discharge Summary  Patient ID: Marcus Harper MRN: [id number] DOB/AGE: 05-18-1951 70 y.o.  Admit date: 04/20/2021 Discharge date: 04/20/2021  Admission Diagnoses: lumbar radiculopathy   Discharge Diagnoses: same   Discharged Condition: good  Hospital Course: The patient was admitted on 04/20/2021 and taken to the operating room where the patient underwent L L5-s1 and l4-5 decompression. The patient tolerated the procedure well and was taken to the recovery room in stable condition. The hospital course was routine. There were no complications. The wound remained clean dry and intact. Pt had appropriate back soreness. No complaints of leg pain or new N/T/W. The patient remained afebrile with stable vital signs, and tolerated a regular diet. The patient continued to increase activities, and pain was well controlled with oral pain medications.   Consults: None  Significant Diagnostic Studies:  Results for orders placed or performed during the hospital encounter of 04/20/21  Surgical pcr screen   Specimen: Nasal Mucosa; Nasal Swab  Result Value Ref Range   MRSA, PCR NEGATIVE NEGATIVE   Staphylococcus aureus NEGATIVE NEGATIVE  SARS Coronavirus 2 by RT PCR (hospital order, performed in [facility] hospital lab) Nasopharyngeal Nasopharyngeal Swab   Specimen: Nasopharyngeal Swab  Result Value Ref Range   SARS Coronavirus 2 NEGATIVE NEGATIVE  Comprehensive metabolic panel per protocol  Result Value Ref Range   Sodium 133 (L) 135 - 145 mmol/L   Potassium 4.0 3.5 - 5.1 mmol/L   Chloride 98 98 - 111 mmol/L   CO2 28 22 - 32 mmol/L   Glucose, Bld 114 (H) 70 - 99 mg/dL   BUN 16 8 - 23 mg/dL   Creatinine, Ser 0.80 0.61 - 1.24 mg/dL   Calcium 9.3 8.9 - 10.3 mg/dL   Total Protein 6.3 (L) 6.5 - 8.1 g/dL   Albumin 3.8 3.5 - 5.0 g/dL   AST 28 15 - 41 U/L   ALT 61 (H) 0 - 44 U/L   Alkaline Phosphatase 61 38 - 126 U/L   Total Bilirubin 0.8 0.3 - 1.2 mg/dL   GFR, Estimated >60 >60 mL/min    Anion gap 7 5 - 15  CBC  Result Value Ref Range   WBC 9.9 4.0 - 10.5 K/uL   RBC 3.95 (L) 4.22 - 5.81 MIL/uL   Hemoglobin 12.7 (L) 13.0 - 17.0 g/dL   HCT 36.5 (L) 39.0 - 52.0 %   MCV 92.4 80.0 - 100.0 fL   MCH 32.2 26.0 - 34.0 pg   MCHC 34.8 30.0 - 36.0 g/dL   RDW 13.0 11.5 - 15.5 %   Platelets 202 150 - 400 K/uL   nRBC 0.0 0.0 - 0.2 %    No results found.  Antibiotics:  Anti-infectives (From admission, onward)    Start     Dose/Rate Route Frequency Ordered Stop   04/20/21 1300  vancomycin (VANCOCIN) IVPB 1000 mg/200 mL premix        1,000 mg 200 mL/hr over 60 Minutes Intravenous  Once 04/20/21 1258 04/20/21 1516       Discharge Exam: Blood pressure 124/67, pulse 74, temperature 97.9 F (36.6 C), resp. rate 15, height 5' 9.5" (1.765 m), weight 79.4 kg, SpO2 99 %. Neurologic: Grossly normal Dressing dry  Discharge Medications:   Allergies as of 04/20/2021       Reactions   Penicillins Other (See Comments)   Childhood not sure of reaction Has tolerated cefazolin per [facility]        Medication List     TAKE these  medications    amLODipine 5 MG tablet Commonly known as: NORVASC Take 5 mg by mouth daily.   aspirin 81 MG EC tablet Take 81 mg by mouth daily.   atorvastatin 80 MG tablet Commonly known as: LIPITOR Take 80 mg by mouth daily.   hydrochlorothiazide 25 MG tablet Commonly known as: HYDRODIURIL Take 25 mg by mouth daily.   latanoprost 0.005 % ophthalmic solution Commonly known as: XALATAN Place 1 drop into both eyes at bedtime.   lisinopril 40 MG tablet Commonly known as: ZESTRIL Take 40 mg by mouth daily.   metoprolol succinate 25 MG 24 hr tablet Commonly known as: TOPROL-XL Take 25 mg by mouth at bedtime.   omeprazole 20 MG capsule Commonly known as: PRILOSEC Take 20 mg by mouth daily.   oxyCODONE-acetaminophen 10-325 MG tablet Commonly known as: PERCOCET Take 1 tablet by mouth every 4 (four) hours as needed for pain. What changed:  when to take this   zolpidem 5 MG tablet Commonly known as: AMBIEN Take 5 mg by mouth at bedtime.        Disposition: home   Final Dx: L L5-S1 lami, L L4-5 extraforaminal decompression  Discharge Instructions      Remove dressing in 72 hours   Complete by: As directed    Call MD for:  difficulty breathing, headache or visual disturbances   Complete by: As directed    Call MD for:  persistant nausea and vomiting   Complete by: As directed    Call MD for:  redness, tenderness, or signs of infection (pain, swelling, redness, odor or green/yellow discharge around incision site)   Complete by: As directed    Call MD for:  severe uncontrolled pain   Complete by: As directed    Call MD for:  temperature >100.4   Complete by: As directed    Diet - low sodium heart healthy   Complete by: As directed    Discharge instructions   Complete by: As directed    No driving for 1 week, may shower normally, no lifting more than a gallon of milk, avoid repetitive bending and twisting or prolonged sitting.   Increase activity slowly   Complete by: As directed         Follow-up Information     Eustace Moore, MD. Schedule an appointment as soon as possible for a visit in 2 week(s).   Specialty: Neurosurgery Contact information: [address] [place] [address] [place] [address] [phone]                  Signed: Eustace Moore 04/20/2021, 6:29 PM

## 2021-04-20 NOTE — H&P (Signed)
Subjective: Patient is a 70 y.o. male admitted for left lower extremity radiculopathy. Onset of symptoms was several months ago, gradually worsening since that time.  The pain is rated severe, and is located at the across the lower back and radiates to left anterior lateral thigh. The pain is described as aching and occurs all day. The symptoms have been progressive. Symptoms are exacerbated by exercise and standing. 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 Authorizing Provider  amLODipine (NORVASC) 5 MG tablet Take 5 mg by mouth daily.   Yes [provider]  atorvastatin (LIPITOR) 80 MG tablet Take 80 mg by mouth daily. 10/16/20 10/16/21 Yes [provider]  hydrochlorothiazide (HYDRODIURIL) 25 MG tablet Take 25 mg by mouth daily.   Yes [provider]  latanoprost (XALATAN) 0.005 % ophthalmic solution Place 1 drop into both eyes at bedtime. 03/18/21  Yes [provider]  lisinopril (ZESTRIL) 40 MG tablet Take 40 mg by mouth daily. 02/16/21  Yes [provider]  metoprolol succinate (TOPROL-XL) 25 MG 24 hr tablet Take 25 mg by mouth at bedtime.   Yes [provider]  omeprazole (PRILOSEC) 20 MG capsule Take 20 mg by mouth daily. 02/03/20  Yes [provider]  oxyCODONE-acetaminophen (PERCOCET) 10-325 MG tablet Take 1 tablet by mouth every 6 (six) hours as needed for pain. 03/19/21  Yes  [provider]  zolpidem (AMBIEN) 5 MG tablet Take 5 mg by mouth at bedtime. 03/28/21  Yes [provider]  aspirin 81 MG EC tablet Take 81 mg by mouth daily.    [provider]   Allergies  Allergen Reactions   Penicillins Other (See Comments)    Childhood not sure of reaction Has tolerated cefazolin per [employer]     Social History   Tobacco Use   Smoking status: Former    Years: 25.00    Types: Cigarettes    Quit date: 1982    Years since quitting: 40.6   Smokeless tobacco: Never  Substance Use Topics   Alcohol use: Not Currently    Family History  Problem Relation Age of Onset   Heart failure Mother    Cancer Father      Review of Systems  Positive ROS: Negative  All other systems have been reviewed and were otherwise negative with the exception of those mentioned in the HPI and as above.  Objective: Vital signs in last 24 hours: Temp:  [98 F (36.7 C)] 98 F (36.7 C) (08/15 1025) Pulse Rate:  [85] 85 (08/15 1025) Resp:  [18] 18 (08/15 1025) BP: (156)/(65) 156/65 (08/15 1025) SpO2:  [97 %] 97 % (08/15 1025) Weight:  [79.4 kg] 79.4 kg (08/15 1025)  General Appearance: Alert, cooperative, no distress, appears stated age Head: Normocephalic, without obvious  abnormality, atraumatic Eyes: PERRL, conjunctiva/corneas clear, EOM's intact    Neck: Supple, symmetrical, trachea midline Back: Symmetric, no curvature, ROM normal, no CVA tenderness Lungs:  respirations unlabored Heart: Regular rate and rhythm Abdomen: Soft, non-tender Extremities: Extremities normal, atraumatic, no cyanosis or edema Pulses: 2+ and symmetric all extremities Skin: Skin color, texture, turgor normal, no rashes or lesions  NEUROLOGIC:   Mental status: Alert and oriented x4,  no aphasia, good attention span, fund of knowledge, and memory Motor Exam - grossly normal Sensory Exam - grossly normal Reflexes: 1+ Coordination - grossly normal Gait - grossly  normal Balance - grossly normal Cranial Nerves: I: smell Not tested  II: visual acuity  OS: nl    OD: nl  II: visual fields Full to confrontation  II: pupils Equal, round, reactive to light  III,VII: ptosis None  III,IV,VI: extraocular muscles  Full ROM  V: mastication Normal  V: facial light touch sensation  Normal  V,VII: corneal reflex  Present  VII: facial muscle function - upper  Normal  VII: facial muscle function - lower Normal  VIII: hearing Not tested  IX: soft palate elevation  Normal  IX,X: gag reflex Present  XI: trapezius strength  5/5  XI: sternocleidomastoid strength 5/5  XI: neck flexion strength  5/5  XII: tongue strength  Normal    Data Review Lab Results  Component Value Date   WBC 9.9 04/20/2021   HGB 12.7 (L) 04/20/2021   HCT 36.5 (L) 04/20/2021   MCV 92.4 04/20/2021   PLT 202 04/20/2021   Lab Results  Component Value Date   NA 133 (L) 04/20/2021   K 4.0 04/20/2021   CL 98 04/20/2021   CO2 28 04/20/2021   BUN 16 04/20/2021   CREATININE 0.80 04/20/2021   GLUCOSE 114 (H) 04/20/2021   No results found for: INR, PROTIME  Assessment/Plan:  Estimated body mass index is 25.47 kg/m as calculated from the following:   Height as of this encounter: 5' 9.5" (1.765 m).   Weight as of this encounter: 79.4 kg. Patient admitted for left L4-5 extraforaminal decompression and discectomy, left L5-S1 hemilaminectomy and medial facetectomy with foraminotomies.. Patient has failed a reasonable attempt at conservative therapy.  I explained the condition and procedure to the patient and answered any questions.  Patient wishes to proceed with procedure as planned. Understands risks/ benefits and typical outcomes of procedure.   Eustace Moore 04/20/2021 3:40 PM

## 2021-04-20 NOTE — Anesthesia Postprocedure Evaluation (Signed)
Anesthesia Post Note  Patient: Marcus Harper  Procedure(s) Performed: Laminectomy and Foraminotomy - Lumbar five-sacral one - left, Lumbar Lumbar four- five extraforaminal decompression (Left: Spine Lumbar)     Patient location during evaluation: PACU Anesthesia Type: General Level of consciousness: awake and alert, patient cooperative and oriented Pain management: pain level controlled Vital Signs Assessment: post-procedure vital signs reviewed and stable Respiratory status: spontaneous breathing, nonlabored ventilation and respiratory function stable Cardiovascular status: blood pressure returned to baseline and stable Postop Assessment: no apparent nausea or vomiting Anesthetic complications: no   No notable events documented.  Last Vitals:  Vitals:   04/20/21 1830 04/20/21 1845  BP: (!) 118/59 127/71  Pulse: 65 68  Resp: 16 15  Temp:  36.9 C  SpO2: 100% 98%    Last Pain:  Vitals:   04/20/21 1845  TempSrc:   PainSc: 3                  Julia Alkhatib,E. Rashena Dowling

## 2021-04-20 NOTE — Progress Notes (Signed)
Notified Dr. Roanna Banning that patient has Medtronic pacemaker;no device orders received. Pt had device check in July that showed normal device function. Per Dr. Roanna Banning, Medtr[employer] rep does not need to come for this surgery. Emergency device orders placed in chart.

## 2021-04-20 NOTE — Op Note (Signed)
04/20/2021  6:02 PM  PATIENT:  Marcus Harper  70 y.o. male  PRE-OPERATIVE DIAGNOSIS: Left lower extremity radiculopathy, normal stenosis L4 S1 on the left, foraminal stenosis L4-5 on the left  POST-OPERATIVE DIAGNOSIS:  same  PROCEDURE:  1.  Extraforaminal decompression L4-5 on the left decompression of the L4 nerve root, 2.  Decompressive lumbar hemilaminectomy medial facetectomy foraminotomies L5-S1 on the left for decompression of the L5 and S1 nerve root  SURGEON:  Sherley Bounds, MD  ASSISTANTS: Glenford Peers FNP  ANESTHESIA:   General  EBL: Less than 50 ml  Total I/O In: 1000 [I.V.:1000] Out: -   BLOOD ADMINISTERED: none  DRAINS: None  SPECIMEN:  none  INDICATION FOR PROCEDURE: This patient presented with severe left leg pain with weakness. Imaging showed significant foraminal stenosis L4-5 and L5-S1 on the left. The patient tried conservative measures without relief. Pain was debilitating. Recommended extraforaminal decompression and possible discectomy L4-5 on the left with left L5-S1 foraminotomy. Patient understood the risks, benefits, and alternatives and potential outcomes and wished to proceed.  PROCEDURE DETAILS: The patient was taken to the operating room and after induction of adequate generalized endotracheal anesthesia, the patient was rolled into the prone position on the Wilson frame and all pressure points were padded. The lumbar region was cleaned and then prepped with DuraPrep and draped in the usual sterile fashion. 5 cc of local anesthesia was injected and then a dorsal midline incision was made and carried down to the lumbo sacral fascia. The fascia was opened and the paraspinous musculature was taken down in a subperiosteal fashion to expose L4-5 and L5-S1 on the left. Intraoperative x-ray confirmed my level, and then I used a combination of the high-speed drill and the Kerrison punches to perform a hemilaminectomy, medial facetectomy, and foraminotomy at  L5-S1 on the left. The underlying yellow ligament was opened and removed in a piecemeal fashion to expose the underlying dura and exiting nerve root.  I drilled a wide laminectomy. I undercut the lateral recess and dissected down until I was medial to and distal to the pedicle of L5 and S1. The L5 and S1 nerve root was well decompressed.  I could see a significant length of the L5 nerve root and skeletonized the L5 pedicle without getting through the pars.  A coronary dilator passed easily along the L5 nerve root into the foramen.   I then used a high-speed drill to perform an extraforaminal decompression at L4-5 on the left.  The pars was very wide and overgrown and there was significant facet arthropathy.  I drilled the lateral part of the pars and superior part of the facet at L4-5 on the left.  The removed 2 large osteophytes that were causing significant compression of the foramen.  There was significant overgrowth of the bone and ligament.  I opened the ligament and was able to identify the underlying exiting L4 nerve root.  I drilled further along the pars and superior facet to get inferior to the root.  The operating microscope was brought into the field for microdissection.  I remove the ligament both superior and inferior to the nerve.  There was a large osteophyte coming off the inferior endplate of L4.  We could not really remove this but it did not appear to be compressing the nerve once the decompression was completed.  The coronary dilator passed very easily along the L4 nerve root.   I then palpated with a coronary dilator along the nerve root and  into the foramen to assure adequate decompression. I felt no more compression of the nerve root at either level. I irrigated with saline solution containing bacitracin. Achieved hemostasis with bipolar cautery and Surgifoam, and then closed the fascia with 0 Vicryl. I closed the subcutaneous tissues with 2-0 Vicryl and the subcuticular tissues with 3-0  Vicryl. The skin was then closed with benzoin and Steri-Strips. The drapes were removed, a sterile dressing was applied.  My nurse practitioner was involved in the exposure, safe retraction of the neural elements, the disc work and the closure. the patient was awakened from general anesthesia and transferred to the recovery room in stable condition. At the end of the procedure all sponge, needle and instrument counts were correct.    PLAN OF CARE: Discharge to home after PACU  PATIENT DISPOSITION:  PACU - hemodynamically stable.   Delay start of Pharmacological VTE agent (>24hrs) due to surgical blood loss or risk of bleeding:  yes

## 2021-04-20 NOTE — Anesthesia Procedure Notes (Signed)
Procedure Name: Intubation Date/Time: 04/20/2021 4:11 PM Performed by: Myna Bright, CRNA Pre-anesthesia Checklist: Patient identified, Emergency Drugs available, Suction available and Patient being monitored Patient Re-evaluated:Patient Re-evaluated prior to induction Oxygen Delivery Method: Circle system utilized Preoxygenation: Pre-oxygenation with 100% oxygen Induction Type: IV induction Ventilation: Mask ventilation without difficulty Laryngoscope Size: Mac and 4 Grade View: Grade I Tube type: Oral Tube size: 7.5 mm Number of attempts: 1 Airway Equipment and Method: Stylet Placement Confirmation: ETT inserted through vocal cords under direct vision, positive ETCO2 and breath sounds checked- equal and bilateral Secured at: 22 cm Tube secured with: Tape Dental Injury: Teeth and Oropharynx as per pre-operative assessment

## 2021-04-20 NOTE — Anesthesia Preprocedure Evaluation (Addendum)
Anesthesia Evaluation  Patient identified by MRN, date of birth, ID band Patient awake    Reviewed: Allergy & Precautions, NPO status , Patient's Chart, lab work & pertinent test results, reviewed documented beta blocker date and time   Airway Mallampati: I  TM Distance: >3 FB Neck ROM: Full    Dental  (+) Edentulous Upper, Edentulous Lower   Pulmonary asthma , former smoker,    breath sounds clear to auscultation       Cardiovascular hypertension, Pt. on medications and Pt. on home beta blockers + CAD  + pacemaker + Valvular Problems/Murmurs  Rhythm:Regular Rate:Normal + Systolic murmurs - s/p AVR   Neuro/Psych  Headaches, Anxiety    GI/Hepatic Neg liver ROS, GERD  Medicated and Controlled,  Endo/Other  negative endocrine ROS  Renal/GU negative Renal ROS     Musculoskeletal  (+) Arthritis ,   Abdominal Normal abdominal exam  (+)   Peds  Hematology negative hematology ROS (+)   Anesthesia Other Findings   Reproductive/Obstetrics                            Anesthesia Physical Anesthesia Plan  ASA: 3  Anesthesia Plan: General   Post-op Pain Management:    Induction: Intravenous  PONV Risk Score and Plan: 3 and Ondansetron, Dexamethasone and Treatment may vary due to age or medical condition  Airway Management Planned: Oral ETT  Additional Equipment: None  Intra-op Plan:   Post-operative Plan: Extubation in OR  Informed Consent: I have reviewed the patients History and Physical, chart, labs and discussed the procedure including the risks, benefits and alternatives for the proposed anesthesia with the patient or authorized representative who has indicated his/her understanding and acceptance.     Dental advisory given  Plan Discussed with: CRNA  Anesthesia Plan Comments:        Anesthesia Quick Evaluation

## 2021-04-21 ENCOUNTER — Encounter (HOSPITAL_COMMUNITY): Payer: Self-pay | Admitting: Neurological Surgery

## 2021-06-30 ENCOUNTER — Other Ambulatory Visit (HOSPITAL_COMMUNITY): Payer: Self-pay | Admitting: Student

## 2021-06-30 DIAGNOSIS — M5416 Radiculopathy, lumbar region: Secondary | ICD-10-CM

## 2021-07-02 ENCOUNTER — Ambulatory Visit (HOSPITAL_COMMUNITY)
Admission: RE | Admit: 2021-07-02 | Discharge: 2021-07-02 | Disposition: A | Discharge status: Home / Self Care | Payer: Medicare HMO | Source: Ambulatory Visit | Attending: Student | Admitting: Student

## 2021-07-02 ENCOUNTER — Other Ambulatory Visit: Payer: Self-pay

## 2021-07-02 ENCOUNTER — Inpatient Hospital Stay (HOSPITAL_COMMUNITY): Admission: RE | Admit: 2021-07-02 | Payer: BC Managed Care – PPO | Source: Ambulatory Visit

## 2021-07-02 ENCOUNTER — Encounter (HOSPITAL_COMMUNITY): Payer: Self-pay

## 2021-07-02 DIAGNOSIS — M5416 Radiculopathy, lumbar region: Secondary | ICD-10-CM | POA: Diagnosis not present

## 2021-07-02 MED ORDER — GADOBUTROL 1 MMOL/ML IV SOLN
8.0000 mL | Freq: Once | INTRAVENOUS | Status: AC | PRN
  Administered 2021-07-02: 8 mL via INTRAVENOUS

## 2021-07-02 NOTE — Progress Notes (Signed)
Changed device settings for MRI to  DOO at 85bpm  Will reProgram device back to pre-MRI settings after completion of exam, and send transmission.

## 2021-08-21 ENCOUNTER — Ambulatory Visit (HOSPITAL_COMMUNITY): Payer: BC Managed Care – PPO

## 2021-09-16 ENCOUNTER — Other Ambulatory Visit: Payer: Self-pay | Admitting: Neurological Surgery

## 2021-10-13 NOTE — Progress Notes (Signed)
Surgical Instructions    Your procedure is scheduled on 10/16/21.  Report to [facility] Main Entrance "A" at 5:30 A.M., then check in with the Admitting office.  Call this number if you have problems the morning of surgery:  [phone]   If you have any questions prior to your surgery date call [phone]: Open Monday-Friday 8am-4pm    Remember:  Do not eat or drink after midnight the night before your surgery      Take these medicines the morning of surgery with A SIP OF WATER:  amLODipine (NORVASC)  atorvastatin (LIPITOR) escitalopram (LEXAPRO) gabapentin (NEURONTIN) omeprazole (PRILOSEC)  IF NEEDED: acetaminophen (TYLENOL)   As of today, STOP taking any Aspirin (unless otherwise instructed by your surgeon) Aleve, Naproxen, Ibuprofen, Motrin, Advil, Goody's, BC's, all herbal medications, fish oil, meloxicam (MOBIC) and all vitamins.           Do not wear jewelry or makeup Do not wear lotions, powders, perfumes/colognes, or deodorant.  Men may shave face and neck. Do not bring valuables to the hospital. Do not wear nail polish, gel polish, artificial nails, or any other type of covering on natural nails (fingers and toes) If you have artificial nails or gel coating that need to be removed by a nail salon, please have this removed prior to surgery. Artificial nails or gel coating may interfere with anesthesia's ability to adequately monitor your vital signs.  [facility] is not responsible for any belongings or valuables. .   Do NOT Smoke (Tobacco/Vaping)  24 hours prior to your procedure  If you use a CPAP at night, you may bring your mask for your overnight stay.   Contacts, glasses, hearing aids, dentures or partials may not be worn into surgery, please bring cases for these belongings   For patients admitted to the hospital, discharge time will be determined by your treatment team.   Patients discharged the day of surgery will not be allowed to drive home, and  someone needs to stay with them for 24 hours.  NO VISITORS WILL BE ALLOWED IN PRE-OP WHERE PATIENTS ARE PREPPED FOR SURGERY.  ONLY 1 SUPPORT PERSON MAY BE PRESENT IN THE WAITING ROOM WHILE YOU ARE IN SURGERY.  IF YOU ARE TO BE ADMITTED, ONCE YOU ARE IN YOUR ROOM YOU WILL BE ALLOWED TWO (2) VISITORS. 1 (ONE) VISITOR MAY STAY OVERNIGHT BUT MUST ARRIVE TO THE ROOM BY 8pm.  Minor children may have two parents present. Special consideration for safety and communication needs will be reviewed on a case by case basis.  Special instructions:    Oral Hygiene is also important to reduce your risk of infection.  Remember - BRUSH YOUR TEETH THE MORNING OF SURGERY WITH YOUR REGULAR TOOTHPASTE   - Preparing For Surgery  Before surgery, you can play an important role. Because skin is not sterile, your skin needs to be as free of germs as possible. You can reduce the number of germs on your skin by washing with CHG (chlorahexidine gluconate) Soap before surgery.  CHG is an antiseptic cleaner which kills germs and bonds with the skin to continue killing germs even after washing.     Please do not use if you have an allergy to CHG or antibacterial soaps. If your skin becomes reddened/irritated stop using the CHG.  Do not shave (including legs and underarms) for at least 48 hours prior to first CHG shower. It is OK to shave your face.  Please follow these instructions carefully.  Shower the NIGHT BEFORE SURGERY and the MORNING OF SURGERY with CHG Soap.   If you chose to wash your hair, wash your hair first as usual with your normal shampoo. After you shampoo, rinse your hair and body thoroughly to remove the shampoo.  Then [employer] and genitals (private parts) with your normal soap and rinse thoroughly to remove soap.  After that Use CHG Soap as you would any other liquid soap. You can apply CHG directly to the skin and wash gently with a scrungie or a clean washcloth.   Apply the CHG Soap to  your body ONLY FROM THE NECK DOWN.  Do not use on open wounds or open sores. Avoid contact with your eyes, ears, mouth and genitals (private parts). Wash Face and genitals (private parts)  with your normal soap.   Wash thoroughly, paying special attention to the area where your surgery will be performed.  Thoroughly rinse your body with warm water from the neck down.  DO NOT shower/wash with your normal soap after using and rinsing off the CHG Soap.  Pat yourself dry with a CLEAN TOWEL.  Wear CLEAN PAJAMAS to bed the night before surgery  Place CLEAN SHEETS on your bed the night before your surgery  DO NOT SLEEP WITH PETS.   Day of Surgery: Take a shower with CHG soap. Wear Clean/Comfortable clothing the morning of surgery Do not apply any deodorants/lotions.   Remember to brush your teeth WITH YOUR REGULAR TOOTHPASTE.    COVID testing  If you are going to stay overnight or be admitted after your procedure/surgery and require a pre-op COVID test, please follow these instructions after your COVID test   You are not required to quarantine however you are required to wear a well-fitting mask when you are out and around people not in your household.  If your mask becomes wet or soiled, replace with a new one.  Wash your hands often with soap and water for 20 seconds or clean your hands with an alcohol-based hand sanitizer that contains at least 60% alcohol.  Do not share personal items.  Notify your provider: if you are in close contact with someone who has COVID  or if you develop a fever of 100.4 or greater, sneezing, cough, sore throat, shortness of breath or body aches.    Please read over the following fact sheets that you were given.

## 2021-10-14 ENCOUNTER — Encounter (HOSPITAL_COMMUNITY): Payer: Self-pay

## 2021-10-14 ENCOUNTER — Encounter (HOSPITAL_COMMUNITY)
Admission: RE | Admit: 2021-10-14 | Discharge: 2021-10-14 | Disposition: A | Discharge status: Home / Self Care | Payer: Medicare HMO | Source: Ambulatory Visit | Attending: Neurological Surgery | Admitting: Neurological Surgery

## 2021-10-14 ENCOUNTER — Other Ambulatory Visit: Payer: Self-pay

## 2021-10-14 VITALS — BP 137/70 | HR 77 | Temp 98.2°F | Resp 18 | Ht 69.0 in | Wt 201.2 lb

## 2021-10-14 DIAGNOSIS — I10 Essential (primary) hypertension: Secondary | ICD-10-CM | POA: Insufficient documentation

## 2021-10-14 DIAGNOSIS — M5416 Radiculopathy, lumbar region: Secondary | ICD-10-CM | POA: Insufficient documentation

## 2021-10-14 DIAGNOSIS — Z953 Presence of xenogenic heart valve: Secondary | ICD-10-CM | POA: Insufficient documentation

## 2021-10-14 DIAGNOSIS — Z8546 Personal history of malignant neoplasm of prostate: Secondary | ICD-10-CM | POA: Insufficient documentation

## 2021-10-14 DIAGNOSIS — I2582 Chronic total occlusion of coronary artery: Secondary | ICD-10-CM | POA: Insufficient documentation

## 2021-10-14 DIAGNOSIS — Z20822 Contact with and (suspected) exposure to covid-19: Secondary | ICD-10-CM | POA: Insufficient documentation

## 2021-10-14 DIAGNOSIS — I2510 Atherosclerotic heart disease of native coronary artery without angina pectoris: Secondary | ICD-10-CM | POA: Insufficient documentation

## 2021-10-14 DIAGNOSIS — Z01812 Encounter for preprocedural laboratory examination: Secondary | ICD-10-CM | POA: Insufficient documentation

## 2021-10-14 DIAGNOSIS — Z01818 Encounter for other preprocedural examination: Secondary | ICD-10-CM

## 2021-10-14 DIAGNOSIS — Z950 Presence of cardiac pacemaker: Secondary | ICD-10-CM | POA: Insufficient documentation

## 2021-10-14 DIAGNOSIS — Z7982 Long term (current) use of aspirin: Secondary | ICD-10-CM | POA: Insufficient documentation

## 2021-10-14 DIAGNOSIS — K219 Gastro-esophageal reflux disease without esophagitis: Secondary | ICD-10-CM | POA: Insufficient documentation

## 2021-10-14 HISTORY — DX: Endocarditis, valve unspecified: I38

## 2021-10-14 HISTORY — DX: Aortic aneurysm of unspecified site, without rupture: I71.9

## 2021-10-14 HISTORY — DX: Depression, unspecified: F32.A

## 2021-10-14 LAB — PROTIME-INR
INR: 1 (ref 0.8–1.2)
Prothrombin Time: 13.5 seconds (ref 11.4–15.2)

## 2021-10-14 LAB — CBC
HCT: 42.9 % (ref 39.0–52.0)
Hemoglobin: 14.3 g/dL (ref 13.0–17.0)
MCH: 31.9 pg (ref 26.0–34.0)
MCHC: 33.3 g/dL (ref 30.0–36.0)
MCV: 95.8 fL (ref 80.0–100.0)
Platelets: 240 10*3/uL (ref 150–400)
RBC: 4.48 MIL/uL (ref 4.22–5.81)
RDW: 11.9 % (ref 11.5–15.5)
WBC: 13.1 10*3/uL — ABNORMAL HIGH (ref 4.0–10.5)
nRBC: 0 % (ref 0.0–0.2)

## 2021-10-14 LAB — BASIC METABOLIC PANEL
Anion gap: 11 (ref 5–15)
BUN: 21 mg/dL (ref 8–23)
CO2: 27 mmol/L (ref 22–32)
Calcium: 9.6 mg/dL (ref 8.9–10.3)
Chloride: 98 mmol/L (ref 98–111)
Creatinine, Ser: 0.97 mg/dL (ref 0.61–1.24)
GFR, Estimated: >60 mL/min (ref >60)
Glucose, Bld: 119 mg/dL — ABNORMAL HIGH (ref 70–99)
Potassium: 4.3 mmol/L (ref 3.5–5.1)
Sodium: 136 mmol/L (ref 135–145)

## 2021-10-14 LAB — TYPE AND SCREEN
ABO/RH(D): A POS
Antibody Screen: NEGATIVE

## 2021-10-14 LAB — SURGICAL PCR SCREEN
MRSA, PCR: NEGATIVE
Staphylococcus aureus: NEGATIVE

## 2021-10-14 NOTE — Progress Notes (Signed)
PCP - Dr. Donley Redder [facility] Cardiologist - Dr. Oren Bracket [facility])  PPM/ICD - [employer] Device Orders - faxed to Dr. Oren Bracket Rep Notified - yes, Logan with [employer] notified  Chest x-ray - 08/05/20 EKG - 09/25/21 Stress Test - 30 years ago per pt, aortic valve replaced (records requested) ECHO - 10/2018 per pt Cardiac Cath - 10/04/17 per pt   Sleep Study - denies  DM- denies   Blood Thinner Instructions: n/a Aspirin Instructions: continue thru DOS. Do not take morning of surgery  ERAS Protcol - no, NPO   COVID TEST- 10/14/21 at PAT   Anesthesia review: yes, cardiac hx. Awaiting pacemaker device instructions from Dr. Oren Bracket (note says they faxed orders to [facility])  Patient denies shortness of breath, fever, cough and chest pain at PAT appointment   All instructions explained to the patient, with a verbal understanding of the material. Patient agrees to go over the instructions while at home for a better understanding. Patient also instructed to wear a mask in public after being tested for COVID-19. The opportunity to ask questions was provided.

## 2021-10-14 NOTE — Progress Notes (Signed)
Pacemaker Device instructions request faxed to Dr. Oren Bracket at [facility] [phone]) [employer] called. Spoke with [employer]. Awaiting device form to be faxed back.

## 2021-10-15 ENCOUNTER — Encounter (HOSPITAL_COMMUNITY): Payer: Self-pay

## 2021-10-15 LAB — SARS CORONAVIRUS 2 (TAT 6-24 HRS): SARS Coronavirus 2: NEGATIVE

## 2021-10-15 NOTE — Anesthesia Preprocedure Evaluation (Addendum)
Anesthesia Evaluation  Patient identified by MRN, date of birth, ID band Patient awake    Reviewed: Allergy & Precautions, NPO status , Patient's Chart, lab work & pertinent test results  Airway Mallampati: II  TM Distance: >3 FB     Dental   Pulmonary former smoker,    breath sounds clear to auscultation       Cardiovascular hypertension, + CAD  + pacemaker  Rhythm:Regular Rate:Normal  History noted Dr. Nyoka Cowden   Neuro/Psych    GI/Hepatic Neg liver ROS, GERD  ,  Endo/Other  negative endocrine ROS  Renal/GU negative Renal ROS     Musculoskeletal   Abdominal   Peds  Hematology   Anesthesia Other Findings   Reproductive/Obstetrics                           Anesthesia Physical Anesthesia Plan  ASA: 3  Anesthesia Plan: General   Post-op Pain Management:    Induction:   PONV Risk Score and Plan: 2 and Ondansetron, Dexamethasone, Midazolam and Treatment may vary due to age or medical condition  Airway Management Planned:   Additional Equipment:   Intra-op Plan:   Post-operative Plan: Possible Post-op intubation/ventilation  Informed Consent: I have reviewed the patients History and Physical, chart, labs and discussed the procedure including the risks, benefits and alternatives for the proposed anesthesia with the patient or authorized representative who has indicated his/her understanding and acceptance.     Dental advisory given  Plan Discussed with: CRNA and Anesthesiologist  Anesthesia Plan Comments: (See PAT note written 10/15/2021 by Myra Gianotti, PA-C. [employer] PPM. )       Anesthesia Quick Evaluation

## 2021-10-15 NOTE — Progress Notes (Signed)
Anesthesia Chart Review:  Case: [id number] Date/Time: 10/16/21 0715   Procedure: PLIF - L4-L5 - L5-S1 (Back)   Anesthesia type: General   Pre-op diagnosis: Radiculopathy   Location: [facility] OR ROOM 21 / [facility]   Surgeons: Eustace Moore, MD       DISCUSSION: Patient is a 71 year old male scheduled for the above procedure.   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  Preoperative cardiology evaluation on 09/25/21. Per Dr. Oren Bracket, "Mr. Marcus Harper presents for routine follow-up. Scheduled to undergo lumbar surgery. He is able to complete > 4 METS of activity without any chest pain or shortness of breath. Blood pressure well controlled. No further cardiac evaluation needed prior to surgery. Continue ASA 81 mg daily through surgery." Lorriane Shire at Dr. Ronnald Ramp office aware. Awaiting PPM perioperative Rx from [facility]. PAT RN did notify Medtr[employer] Rep of surgery plans.   Anesthesia team to evaluate on the day of surgery.  10/14/2021 COVID-19 test negative.   VS: BP 137/70    Pulse 77    Temp 36.8 C (Oral)    Resp 18    Ht 5\' 9"  (1.753 m)    Wt 91.3 kg    SpO2 96%    BMI 29.71 kg/m    PROVIDERS: Dsa, Mayford Knife, MD is PCP Eyvonne Left, MD is cardiologist ([facility]). [place] Lenoria Chime, MD is urologist Heide Guile, MD is  HEM-[facility]   LABS: Preoperative labs noted.  WBC 13K called to Lorriane Shire at Dr. Ronnald Ramp' office. (all labs ordered are listed, but only abnormal results are displayed)  Labs Reviewed  BASIC METABOLIC PANEL - Abnormal; Notable for the following components:      Result Value   Glucose, Bld 119 (*)    All other components within normal limits  CBC - Abnormal; Notable for the following components:   WBC 13.1 (*)    All other components within normal limits  SARS CORONAVIRUS 2 (TAT 6-24 HRS)  SURGICAL PCR SCREEN  PROTIME-INR  TYPE AND SCREEN     IMAGES: MRI L-spine 07/02/21: IMPRESSION: 1. At L5-S1, interval left laminectomy with similar moderate left and mild-to-moderate right foraminal stenosis. Patent canal. 2. At L4-L5, postsurgical changes of left extraforaminal decompression without substantial change in mild to moderate left foraminal stenosis. Similar mild left subarticular recess stenosis.  CXR 04/29/21 ([facility]): FINDINGS:  Support Devices: Left subclavian approach cardiac rhythm maintenance device with similar position of leads.  Cardiovascular/Mediastinum: Cardiopericardial silhouette and pulmonary vasculature are within normal limits. Postsurgical changes of CABG and aortic valve replacement.  Lungs/pleura: No focal airspace opacity. No pneumothorax. No pleural effusion.  Upper abdomen: Visible portions of the upper abdomen are within normal limits.  Chest wall/osseous structures: No acute osseous abnormality. Multilevel degenerative changes of the spine. IMPRESSION: There is no evidence of acute cardiac or pulmonary abnormality.   EKG:  EKG 09/25/21 ([facility]): Per Narrative  in [facility]: Atrial-paced rhythm with prolonged AV conduction with occasional Premature ventricular complexes  Nonspecific intraventricular block  When compared with ECG of 08-May-2020 10:14,  Premature ventricular complexes are now present  Confirmed by Urich [id number]) on  09/25/2021 1:15:02 PM   EKG 04/20/21: Atrial-paced rhythm with prolonged AV conduction with Premature atrial complexes with Abberant conduction Left bundle branch block Abnormal ECG No previous tracing Confirmed by Quay Burow [id number]) on 04/20/2021 2:39:14 PM   CV: Echo 10/14/20 ([facility]): SUMMARY  There is normal left ventricular wall thickness.  The left ventricular size is normal.  Left ventricular systolic function is normal.  The right ventricle is normal size.  The right ventricular systolic function is normal.  Right ventricular device lead noted.  Evolut 76mm TAVR valve with trace paravalvular leak. No evidence of  significant prosthetic valve stenosis (peak velocity is 2.5 m/s and  acceleration time is 70 ms).  There was insufficient TR detected to calculate RV systolic pressure.  The IVC is normal in size with an inspiratory collapse of greater then  50%, suggesting normal right atrial pressure.  There is no pericardial effusion.  Probably no significant change in comparison with the prior study  noted     US Carotid 10/14/20 ([facility]): Per 10/16/20 office note Dr. Lenis Noon, MD, "nonsignificant carotid stenosis"   RHC/LHC 10/04/17 ([facility]): Pressures    PA 36 mmHg/19 mmHg/26 mmHg  PCW 16 mm Hg  AO 104 mmHg/45 mmHg/69 mmHg  BP 123/64   Saturations    PA Saturation O2-Sat 67  AO Saturation O2-Sat 98   Coronary Findings Diagnostic Dominance: Right  Left Main: The vessel is angiographically normal.  Left Anterior Descending: Prox LAD lesion, 20% stenosed. Mid LAD lesion, 40% stenosed. Dist LAD lesion, 20% stenosed. First Septal Branch: The vessel is large. Ost 1st Sept to 1st Sept lesion, 90% stenosed.  Left Circumflex: Prox Cx lesion, 20% stenosed. Mid Cx lesion, 20% stenosed.  Right Coronary Artery: Mid RCA lesion, 100% stenosed.   Multi-vessel coronary artery disease.   Maximize medical therapy.    Past Medical History:  Diagnosis Date   Anxiety     situation   Aortic aneurysm [facility])    s/p bioprosthetic AVR & replacement of ascending TAA 08/19/06 in [place]; TAVR 10/26/17 for bioprosthetic AS   Arthritis    Asthma    as a child   Cancer ([place]) 2003   prostate   Coronary artery disease    CTO of RCA on LHC in 2019   Depression    Endocarditis 11/2010   s/p 6 weeks IV antibiotics   GERD (gastroesophageal reflux disease)    Hypertension    Presence of permanent cardiac pacemaker     Past Surgical History:  Procedure Laterality Date   AORTIC VALVE REPLACEMENT     valve replaced twice. Bovine in 2007. TAVR 10/26/17   BLADDER SUSPENSION     CARDIAC CATHETERIZATION     COLONOSCOPY     KNEE ARTHROSCOPY     meniscus removed, pt unsure of which side   LUMBAR LAMINECTOMY/DECOMPRESSION MICRODISCECTOMY Left 04/20/2021   Procedure: Laminectomy and Foraminotomy - Lumbar five-sacral one - left, Lumbar Lumbar four- five extraforaminal decompression;  Surgeon: Eustace Moore, MD;  Location: [facility];  Service: Neurosurgery;  Laterality: Left;  3C   MEDIASTERNOTOMY     for 25 mm bioprosthetic AVR & replacement of ascending aortic aneurysm at [facility] in [place] on 08/19/2006   MULTIPLE TOOTH  EXTRACTIONS     PACEMAKER INSERTION  11/02/2017   PROSTATECTOMY  2003   TONSILLECTOMY     and adenoidectomy as a child   URETHRAL SLING  07/23/2013   pt has had this done twice   VASECTOMY      MEDICATIONS:  acetaminophen (TYLENOL) 500 MG tablet   amLODipine (NORVASC) 5 MG tablet   aspirin 81 MG EC tablet   atorvastatin (LIPITOR) 80 MG tablet   Camphor-Menthol-Methyl Sal (SALONPAS) 3.09-11-08 % PTCH   escitalopram (LEXAPRO) 5 MG tablet   gabapentin (NEURONTIN) 300 MG capsule   hydrochlorothiazide (HYDRODIURIL) 25 MG tablet   latanoprost (XALATAN) 0.005 % ophthalmic solution   lisinopril (ZESTRIL) 40 MG tablet   meloxicam (MOBIC) 15 MG tablet   metoprolol succinate (TOPROL-XL) 25 MG 24 hr tablet   omeprazole (PRILOSEC) 20 MG capsule    traZODone (DESYREL) 100 MG tablet   No current facility-administered medications for this encounter.    Myra Gianotti, PA-C Surgical Short Stay/Anesthesiology [facility] Phone [phone] [facility] Phone [phone] 10/15/2021 11:08 AM

## 2021-10-16 ENCOUNTER — Encounter (HOSPITAL_COMMUNITY)
Admission: RE | Disposition: A | Discharge status: Home / Self Care | Payer: Self-pay | Source: Home / Self Care | Attending: Neurological Surgery

## 2021-10-16 ENCOUNTER — Other Ambulatory Visit: Payer: Self-pay

## 2021-10-16 ENCOUNTER — Inpatient Hospital Stay (HOSPITAL_COMMUNITY): Payer: Medicare HMO | Admitting: Vascular Surgery

## 2021-10-16 ENCOUNTER — Inpatient Hospital Stay (HOSPITAL_COMMUNITY)
Admission: RE | Admit: 2021-10-16 | Discharge: 2021-10-17 | DRG: 455 | Disposition: A | Discharge status: Home / Self Care | Payer: Medicare HMO | Attending: Neurological Surgery | Admitting: Neurological Surgery

## 2021-10-16 ENCOUNTER — Inpatient Hospital Stay (HOSPITAL_COMMUNITY): Payer: Medicare HMO | Admitting: Anesthesiology

## 2021-10-16 ENCOUNTER — Inpatient Hospital Stay (HOSPITAL_COMMUNITY): Payer: Medicare HMO

## 2021-10-16 ENCOUNTER — Encounter (HOSPITAL_COMMUNITY): Payer: Self-pay | Admitting: Neurological Surgery

## 2021-10-16 DIAGNOSIS — Z79899 Other long term (current) drug therapy: Secondary | ICD-10-CM

## 2021-10-16 DIAGNOSIS — Z880 Allergy status to penicillin: Secondary | ICD-10-CM

## 2021-10-16 DIAGNOSIS — Z87891 Personal history of nicotine dependence: Secondary | ICD-10-CM | POA: Diagnosis not present

## 2021-10-16 DIAGNOSIS — Z981 Arthrodesis status: Secondary | ICD-10-CM

## 2021-10-16 DIAGNOSIS — Z419 Encounter for procedure for purposes other than remedying health state, unspecified: Secondary | ICD-10-CM

## 2021-10-16 DIAGNOSIS — M4726 Other spondylosis with radiculopathy, lumbar region: Secondary | ICD-10-CM | POA: Diagnosis present

## 2021-10-16 DIAGNOSIS — M4807 Spinal stenosis, lumbosacral region: Secondary | ICD-10-CM | POA: Diagnosis present

## 2021-10-16 DIAGNOSIS — M2578 Osteophyte, vertebrae: Secondary | ICD-10-CM | POA: Diagnosis present

## 2021-10-16 DIAGNOSIS — I2510 Atherosclerotic heart disease of native coronary artery without angina pectoris: Secondary | ICD-10-CM | POA: Diagnosis present

## 2021-10-16 DIAGNOSIS — Z7982 Long term (current) use of aspirin: Secondary | ICD-10-CM

## 2021-10-16 DIAGNOSIS — Z20822 Contact with and (suspected) exposure to covid-19: Secondary | ICD-10-CM | POA: Diagnosis present

## 2021-10-16 DIAGNOSIS — Z950 Presence of cardiac pacemaker: Secondary | ICD-10-CM | POA: Diagnosis not present

## 2021-10-16 DIAGNOSIS — M5416 Radiculopathy, lumbar region: Secondary | ICD-10-CM | POA: Diagnosis not present

## 2021-10-16 DIAGNOSIS — K219 Gastro-esophageal reflux disease without esophagitis: Secondary | ICD-10-CM | POA: Diagnosis present

## 2021-10-16 DIAGNOSIS — Z791 Long term (current) use of non-steroidal anti-inflammatories (NSAID): Secondary | ICD-10-CM | POA: Diagnosis not present

## 2021-10-16 DIAGNOSIS — I10 Essential (primary) hypertension: Secondary | ICD-10-CM

## 2021-10-16 DIAGNOSIS — M48061 Spinal stenosis, lumbar region without neurogenic claudication: Secondary | ICD-10-CM | POA: Diagnosis present

## 2021-10-16 DIAGNOSIS — Z953 Presence of xenogenic heart valve: Secondary | ICD-10-CM | POA: Diagnosis not present

## 2021-10-16 DIAGNOSIS — Z8249 Family history of ischemic heart disease and other diseases of the circulatory system: Secondary | ICD-10-CM

## 2021-10-16 DIAGNOSIS — Z8546 Personal history of malignant neoplasm of prostate: Secondary | ICD-10-CM | POA: Diagnosis not present

## 2021-10-16 LAB — ABO/RH: ABO/RH(D): A POS

## 2021-10-16 SURGERY — POSTERIOR LUMBAR FUSION 2 LEVEL
Anesthesia: General | Site: Spine Lumbar

## 2021-10-16 MED ORDER — ONDANSETRON HCL 4 MG PO TABS: 4.0000 mg | ORAL_TABLET | Freq: Four times a day (QID) | ORAL | Status: DC | PRN

## 2021-10-16 MED ORDER — ROCURONIUM BROMIDE 10 MG/ML (PF) SYRINGE
PREFILLED_SYRINGE | INTRAVENOUS | Status: AC
  Filled 2021-10-16: qty 10

## 2021-10-16 MED ORDER — THROMBIN 20000 UNITS EX SOLR
CUTANEOUS | Status: AC
  Filled 2021-10-16: qty 20000

## 2021-10-16 MED ORDER — MENTHOL 3 MG MT LOZG: 1.0000 | LOZENGE | OROMUCOSAL | Status: DC | PRN

## 2021-10-16 MED ORDER — HYDROMORPHONE HCL 1 MG/ML IJ SOLN: 0.5000 mg | INTRAMUSCULAR | Status: DC | PRN

## 2021-10-16 MED ORDER — SENNA 8.6 MG PO TABS
1.0000 | ORAL_TABLET | Freq: Two times a day (BID) | ORAL | Status: DC
  Administered 2021-10-16 – 2021-10-17 (×3): 8.6 mg via ORAL
  Filled 2021-10-16 (×3): qty 1

## 2021-10-16 MED ORDER — EPHEDRINE 5 MG/ML INJ
INTRAVENOUS | Status: AC
  Filled 2021-10-16: qty 5

## 2021-10-16 MED ORDER — GABAPENTIN 300 MG PO CAPS
300.0000 mg | ORAL_CAPSULE | Freq: Three times a day (TID) | ORAL | Status: DC
  Administered 2021-10-16 – 2021-10-17 (×3): 300 mg via ORAL
  Filled 2021-10-16 (×3): qty 1

## 2021-10-16 MED ORDER — PANTOPRAZOLE SODIUM 40 MG PO TBEC
40.0000 mg | DELAYED_RELEASE_TABLET | Freq: Every day | ORAL | Status: DC
  Administered 2021-10-17: 40 mg via ORAL
  Filled 2021-10-16: qty 1

## 2021-10-16 MED ORDER — HYDROMORPHONE HCL 1 MG/ML IJ SOLN
INTRAMUSCULAR | Status: AC
  Filled 2021-10-16: qty 1

## 2021-10-16 MED ORDER — ONDANSETRON HCL 4 MG/2ML IJ SOLN: 4.0000 mg | Freq: Four times a day (QID) | INTRAMUSCULAR | Status: DC | PRN

## 2021-10-16 MED ORDER — ONDANSETRON HCL 4 MG/2ML IJ SOLN
INTRAMUSCULAR | Status: AC
  Filled 2021-10-16: qty 2

## 2021-10-16 MED ORDER — THROMBIN 5000 UNITS EX SOLR
CUTANEOUS | Status: AC
  Filled 2021-10-16: qty 5000

## 2021-10-16 MED ORDER — PHENYLEPHRINE HCL (PRESSORS) 10 MG/ML IV SOLN
INTRAVENOUS | Status: AC
  Filled 2021-10-16: qty 1

## 2021-10-16 MED ORDER — TRAZODONE HCL 50 MG PO TABS
100.0000 mg | ORAL_TABLET | Freq: Every day | ORAL | Status: DC
  Administered 2021-10-16: 100 mg via ORAL
  Filled 2021-10-16: qty 2

## 2021-10-16 MED ORDER — LATANOPROST 0.005 % OP SOLN
1.0000 [drp] | Freq: Every day | OPHTHALMIC | Status: DC
  Filled 2021-10-16: qty 2.5

## 2021-10-16 MED ORDER — GABAPENTIN 300 MG PO CAPS
300.0000 mg | ORAL_CAPSULE | ORAL | Status: DC
  Filled 2021-10-16: qty 1

## 2021-10-16 MED ORDER — PHENYLEPHRINE 40 MCG/ML (10ML) SYRINGE FOR IV PUSH (FOR BLOOD PRESSURE SUPPORT)
PREFILLED_SYRINGE | INTRAVENOUS | Status: AC
  Filled 2021-10-16: qty 10

## 2021-10-16 MED ORDER — ACETAMINOPHEN 500 MG PO TABS
1000.0000 mg | ORAL_TABLET | Freq: Four times a day (QID) | ORAL | Status: DC
  Administered 2021-10-16 – 2021-10-17 (×3): 1000 mg via ORAL
  Filled 2021-10-16 (×3): qty 2

## 2021-10-16 MED ORDER — ACETAMINOPHEN 500 MG PO TABS
1000.0000 mg | ORAL_TABLET | ORAL | Status: AC
  Administered 2021-10-16: 1000 mg via ORAL
  Filled 2021-10-16: qty 2

## 2021-10-16 MED ORDER — PHENOL 1.4 % MT LIQD: 1.0000 | OROMUCOSAL | Status: DC | PRN

## 2021-10-16 MED ORDER — OXYCODONE HCL 5 MG PO TABS
10.0000 mg | ORAL_TABLET | ORAL | Status: DC | PRN
  Administered 2021-10-16 (×2): 10 mg via ORAL
  Filled 2021-10-16 (×2): qty 2

## 2021-10-16 MED ORDER — ROCURONIUM BROMIDE 10 MG/ML (PF) SYRINGE
PREFILLED_SYRINGE | INTRAVENOUS | Status: DC | PRN
  Administered 2021-10-16: 20 mg via INTRAVENOUS
  Administered 2021-10-16: 50 mg via INTRAVENOUS
  Administered 2021-10-16: 30 mg via INTRAVENOUS
  Administered 2021-10-16: 20 mg via INTRAVENOUS
  Administered 2021-10-16: 30 mg via INTRAVENOUS
  Administered 2021-10-16: 10 mg via INTRAVENOUS

## 2021-10-16 MED ORDER — VANCOMYCIN HCL IN DEXTROSE 1-5 GM/200ML-% IV SOLN
1000.0000 mg | INTRAVENOUS | Status: AC
  Administered 2021-10-16: 1000 mg via INTRAVENOUS
  Filled 2021-10-16: qty 200

## 2021-10-16 MED ORDER — BUPIVACAINE HCL (PF) 0.25 % IJ SOLN
INTRAMUSCULAR | Status: AC
  Filled 2021-10-16: qty 30

## 2021-10-16 MED ORDER — ONDANSETRON HCL 4 MG/2ML IJ SOLN
INTRAMUSCULAR | Status: DC | PRN
  Administered 2021-10-16: 4 mg via INTRAVENOUS

## 2021-10-16 MED ORDER — CHLORHEXIDINE GLUCONATE 0.12 % MT SOLN
15.0000 mL | Freq: Once | OROMUCOSAL | Status: AC
  Administered 2021-10-16: 15 mL via OROMUCOSAL
  Filled 2021-10-16: qty 15

## 2021-10-16 MED ORDER — HYDROCHLOROTHIAZIDE 25 MG PO TABS
25.0000 mg | ORAL_TABLET | Freq: Every morning | ORAL | Status: DC
  Administered 2021-10-17: 25 mg via ORAL
  Filled 2021-10-16: qty 1

## 2021-10-16 MED ORDER — LIDOCAINE 2% (20 MG/ML) 5 ML SYRINGE
INTRAMUSCULAR | Status: AC
  Filled 2021-10-16: qty 5

## 2021-10-16 MED ORDER — PROPOFOL 10 MG/ML IV BOLUS
INTRAVENOUS | Status: AC
  Filled 2021-10-16: qty 20

## 2021-10-16 MED ORDER — LIDOCAINE 2% (20 MG/ML) 5 ML SYRINGE
INTRAMUSCULAR | Status: DC | PRN
  Administered 2021-10-16: 60 mg via INTRAVENOUS

## 2021-10-16 MED ORDER — BUPIVACAINE HCL (PF) 0.25 % IJ SOLN
INTRAMUSCULAR | Status: DC | PRN
  Administered 2021-10-16: 4 mL
  Administered 2021-10-16: 15 mL

## 2021-10-16 MED ORDER — LACTATED RINGERS IV SOLN
INTRAVENOUS | Status: DC | PRN
Start: 2021-10-16 — End: 2021-10-16

## 2021-10-16 MED ORDER — SUGAMMADEX SODIUM 200 MG/2ML IV SOLN
INTRAVENOUS | Status: DC | PRN
Start: 2021-10-16 — End: 2021-10-16
  Administered 2021-10-16: 200 mg via INTRAVENOUS

## 2021-10-16 MED ORDER — THROMBIN 20000 UNITS EX SOLR: CUTANEOUS | Status: DC | PRN

## 2021-10-16 MED ORDER — VANCOMYCIN HCL IN DEXTROSE 1-5 GM/200ML-% IV SOLN
1000.0000 mg | Freq: Once | INTRAVENOUS | Status: AC
Start: 2021-10-16 — End: 2021-10-16
  Administered 2021-10-16: 1000 mg via INTRAVENOUS
  Filled 2021-10-16: qty 200

## 2021-10-16 MED ORDER — POTASSIUM CHLORIDE IN NACL 20-0.9 MEQ/L-% IV SOLN: INTRAVENOUS | Status: DC

## 2021-10-16 MED ORDER — SODIUM CHLORIDE 0.9% FLUSH
3.0000 mL | Freq: Two times a day (BID) | INTRAVENOUS | Status: DC
  Administered 2021-10-16: 3 mL via INTRAVENOUS

## 2021-10-16 MED ORDER — DEXAMETHASONE SODIUM PHOSPHATE 10 MG/ML IJ SOLN
INTRAMUSCULAR | Status: AC
  Filled 2021-10-16: qty 1

## 2021-10-16 MED ORDER — SODIUM CHLORIDE 0.9% FLUSH: 3.0000 mL | INTRAVENOUS | Status: DC | PRN

## 2021-10-16 MED ORDER — DEXAMETHASONE SODIUM PHOSPHATE 4 MG/ML IJ SOLN
INTRAMUSCULAR | Status: DC | PRN
  Administered 2021-10-16: 10 mg via INTRAVENOUS

## 2021-10-16 MED ORDER — 0.9 % SODIUM CHLORIDE (POUR BTL) OPTIME
TOPICAL | Status: DC | PRN
  Administered 2021-10-16: 1000 mL

## 2021-10-16 MED ORDER — EPHEDRINE SULFATE-NACL 50-0.9 MG/10ML-% IV SOSY
PREFILLED_SYRINGE | INTRAVENOUS | Status: DC | PRN
  Administered 2021-10-16 (×2): 5 mg via INTRAVENOUS

## 2021-10-16 MED ORDER — AMLODIPINE BESYLATE 5 MG PO TABS
5.0000 mg | ORAL_TABLET | Freq: Every morning | ORAL | Status: DC
  Administered 2021-10-17: 5 mg via ORAL
  Filled 2021-10-16: qty 1

## 2021-10-16 MED ORDER — CELECOXIB 200 MG PO CAPS
200.0000 mg | ORAL_CAPSULE | Freq: Two times a day (BID) | ORAL | Status: DC
  Administered 2021-10-16 – 2021-10-17 (×3): 200 mg via ORAL
  Filled 2021-10-16 (×3): qty 1

## 2021-10-16 MED ORDER — ORAL CARE MOUTH RINSE: 15.0000 mL | Freq: Once | OROMUCOSAL | Status: AC

## 2021-10-16 MED ORDER — METHOCARBAMOL 500 MG PO TABS
500.0000 mg | ORAL_TABLET | Freq: Four times a day (QID) | ORAL | Status: DC | PRN
  Administered 2021-10-17: 500 mg via ORAL
  Filled 2021-10-16: qty 1

## 2021-10-16 MED ORDER — FENTANYL CITRATE (PF) 250 MCG/5ML IJ SOLN
INTRAMUSCULAR | Status: AC
  Filled 2021-10-16: qty 5

## 2021-10-16 MED ORDER — LACTATED RINGERS IV SOLN: INTRAVENOUS | Status: DC

## 2021-10-16 MED ORDER — DEXAMETHASONE SODIUM PHOSPHATE 4 MG/ML IJ SOLN: 4.0000 mg | Freq: Four times a day (QID) | INTRAMUSCULAR | Status: DC

## 2021-10-16 MED ORDER — CHLORHEXIDINE GLUCONATE CLOTH 2 % EX PADS: 6.0000 | MEDICATED_PAD | Freq: Once | CUTANEOUS | Status: DC

## 2021-10-16 MED ORDER — ESCITALOPRAM OXALATE 10 MG PO TABS
5.0000 mg | ORAL_TABLET | Freq: Every morning | ORAL | Status: DC
  Administered 2021-10-17: 5 mg via ORAL
  Filled 2021-10-16: qty 0.5

## 2021-10-16 MED ORDER — SODIUM CHLORIDE 0.9 % IV SOLN: 250.0000 mL | INTRAVENOUS | Status: DC

## 2021-10-16 MED ORDER — PHENYLEPHRINE 40 MCG/ML (10ML) SYRINGE FOR IV PUSH (FOR BLOOD PRESSURE SUPPORT)
PREFILLED_SYRINGE | INTRAVENOUS | Status: DC | PRN
  Administered 2021-10-16: 40 ug via INTRAVENOUS
  Administered 2021-10-16 (×3): 80 ug via INTRAVENOUS
  Administered 2021-10-16: 120 ug via INTRAVENOUS

## 2021-10-16 MED ORDER — METOPROLOL SUCCINATE ER 25 MG PO TB24: 25.0000 mg | ORAL_TABLET | Freq: Every day | ORAL | Status: DC

## 2021-10-16 MED ORDER — ASPIRIN EC 81 MG PO TBEC
81.0000 mg | DELAYED_RELEASE_TABLET | Freq: Every day | ORAL | Status: DC
  Administered 2021-10-16 – 2021-10-17 (×2): 81 mg via ORAL
  Filled 2021-10-16 (×2): qty 1

## 2021-10-16 MED ORDER — LISINOPRIL 20 MG PO TABS
40.0000 mg | ORAL_TABLET | Freq: Every morning | ORAL | Status: DC
  Administered 2021-10-17: 40 mg via ORAL
  Filled 2021-10-16: qty 2

## 2021-10-16 MED ORDER — DEXAMETHASONE 4 MG PO TABS
4.0000 mg | ORAL_TABLET | Freq: Four times a day (QID) | ORAL | Status: DC
  Administered 2021-10-16 – 2021-10-17 (×3): 4 mg via ORAL
  Filled 2021-10-16 (×3): qty 1

## 2021-10-16 MED ORDER — THROMBIN 5000 UNITS EX SOLR: OROMUCOSAL | Status: DC | PRN

## 2021-10-16 MED ORDER — PHENYLEPHRINE HCL-NACL 20-0.9 MG/250ML-% IV SOLN
INTRAVENOUS | Status: DC | PRN
  Administered 2021-10-16: 100 ug/min via INTRAVENOUS

## 2021-10-16 MED ORDER — METHOCARBAMOL 1000 MG/10ML IJ SOLN
500.0000 mg | Freq: Four times a day (QID) | INTRAVENOUS | Status: DC | PRN
  Filled 2021-10-16: qty 5

## 2021-10-16 MED ORDER — HYDROMORPHONE HCL 1 MG/ML IJ SOLN
0.2500 mg | INTRAMUSCULAR | Status: DC | PRN
  Administered 2021-10-16 (×2): 0.5 mg via INTRAVENOUS

## 2021-10-16 MED ORDER — PROPOFOL 10 MG/ML IV BOLUS
INTRAVENOUS | Status: DC | PRN
  Administered 2021-10-16: 20 mg via INTRAVENOUS
  Administered 2021-10-16: 150 mg via INTRAVENOUS

## 2021-10-16 MED ORDER — FENTANYL CITRATE (PF) 100 MCG/2ML IJ SOLN
INTRAMUSCULAR | Status: DC | PRN
  Administered 2021-10-16 (×2): 50 ug via INTRAVENOUS
  Administered 2021-10-16 (×2): 25 ug via INTRAVENOUS
  Administered 2021-10-16: 50 ug via INTRAVENOUS
  Administered 2021-10-16 (×2): 25 ug via INTRAVENOUS

## 2021-10-16 SURGICAL SUPPLY — 62 items
BAG COUNTER SPONGE SURGICOUNT (BAG) ×3 IMPLANT
BASKET BONE COLLECTION (BASKET) ×2 IMPLANT
BENZOIN TINCTURE PRP APPL 2/3 (GAUZE/BANDAGES/DRESSINGS) ×2 IMPLANT
BUR CARBIDE MATCH 3.0 (BURR) ×2 IMPLANT
CAGE POROUS ATEC 7X9X25 5D (Cage) ×2 IMPLANT
CANISTER SUCT 3000ML PPV (MISCELLANEOUS) ×2 IMPLANT
CNTNR URN SCR LID CUP LEK RST (MISCELLANEOUS) ×1 IMPLANT
CONT SPEC 4OZ STRL OR WHT (MISCELLANEOUS) ×1
COVER BACK TABLE 60X90IN (DRAPES) ×2 IMPLANT
DERMABOND ADVANCED (GAUZE/BANDAGES/DRESSINGS) ×1
DERMABOND ADVANCED .7 DNX12 (GAUZE/BANDAGES/DRESSINGS) ×1 IMPLANT
DRAPE C-ARM 42X72 X-RAY (DRAPES) ×4 IMPLANT
DRAPE C-ARMOR (DRAPES) ×1 IMPLANT
DRAPE LAPAROTOMY 100X72X124 (DRAPES) ×2 IMPLANT
DRAPE SURG 17X23 STRL (DRAPES) ×2 IMPLANT
DRSG OPSITE POSTOP 4X6 (GAUZE/BANDAGES/DRESSINGS) ×1 IMPLANT
DURAPREP 26ML APPLICATOR (WOUND CARE) ×2 IMPLANT
ELECT REM PT RETURN 9FT ADLT (ELECTROSURGICAL) ×2
ELECTRODE REM PT RTRN 9FT ADLT (ELECTROSURGICAL) ×1 IMPLANT
EVACUATOR 1/8 PVC DRAIN (DRAIN) ×1 IMPLANT
GAUZE 4X4 16PLY ~~LOC~~+RFID DBL (SPONGE) ×1 IMPLANT
GLOVE SURG ENC MOIS LTX SZ7 (GLOVE) ×1 IMPLANT
GLOVE SURG ENC MOIS LTX SZ7.5 (GLOVE) ×2 IMPLANT
GLOVE SURG ENC MOIS LTX SZ8 (GLOVE) ×4 IMPLANT
GLOVE SURG UNDER POLY LF SZ7 (GLOVE) ×1 IMPLANT
GLOVE SURG UNDER POLY LF SZ7.5 (GLOVE) ×4 IMPLANT
GOWN STRL REUS W/ TWL LRG LVL3 (GOWN DISPOSABLE) IMPLANT
GOWN STRL REUS W/ TWL XL LVL3 (GOWN DISPOSABLE) ×2 IMPLANT
GOWN STRL REUS W/TWL 2XL LVL3 (GOWN DISPOSABLE) IMPLANT
GOWN STRL REUS W/TWL LRG LVL3 (GOWN DISPOSABLE) ×2
GOWN STRL REUS W/TWL XL LVL3 (GOWN DISPOSABLE) ×2
GRAFT BONE PROTEIOS LRG 5CC (Orthopedic Implant) ×2 IMPLANT
HEMOSTAT POWDER KIT SURGIFOAM (HEMOSTASIS) ×1 IMPLANT
KIT BASIN OR (CUSTOM PROCEDURE TRAY) ×2 IMPLANT
KIT GRAFTMAG DEL NEURO DISP (NEUROSURGERY SUPPLIES) ×1 IMPLANT
KIT TURNOVER KIT B (KITS) ×2 IMPLANT
MATRIX SPINE STRIP NEOCORE 5CC (Putty) IMPLANT
MILL MEDIUM DISP (BLADE) ×1 IMPLANT
NDL HYPO 25X1 1.5 SAFETY (NEEDLE) ×1 IMPLANT
NEEDLE HYPO 25X1 1.5 SAFETY (NEEDLE) ×2 IMPLANT
NS IRRIG 1000ML POUR BTL (IV SOLUTION) ×2 IMPLANT
PACK LAMINECTOMY NEURO (CUSTOM PROCEDURE TRAY) ×2 IMPLANT
ROD LORD LIPPED TI 5.5X50 (Rod) ×2 IMPLANT
SCREW CANC SHANK MOD 6.5X45 (Screw) ×2 IMPLANT
SCREW KODIAK 6.5X40 (Screw) ×4 IMPLANT
SCREW POLYAXIAL TULIP (Screw) ×2 IMPLANT
SET SCREW (Screw) ×6 IMPLANT
SET SCREW SPNE (Screw) IMPLANT
SPACER PS POROUS 8X9X25 10D (Spacer) ×2 IMPLANT
SPONGE SURGIFOAM ABS GEL 100 (HEMOSTASIS) ×2 IMPLANT
SPONGE T-LAP 4X18 ~~LOC~~+RFID (SPONGE) ×2 IMPLANT
STRIP CLOSURE SKIN 1/2X4 (GAUZE/BANDAGES/DRESSINGS) ×3 IMPLANT
STRIP MATRIX NEOCORE 5CC (Putty) ×2 IMPLANT
SUT VIC AB 0 CT1 18XCR BRD8 (SUTURE) ×1 IMPLANT
SUT VIC AB 0 CT1 8-18 (SUTURE) ×1
SUT VIC AB 2-0 CP2 18 (SUTURE) ×2 IMPLANT
SUT VIC AB 3-0 SH 8-18 (SUTURE) ×4 IMPLANT
SYR CONTROL 10ML LL (SYRINGE) ×3 IMPLANT
TOWEL GREEN STERILE (TOWEL DISPOSABLE) ×2 IMPLANT
TOWEL GREEN STERILE FF (TOWEL DISPOSABLE) ×2 IMPLANT
TRAY FOLEY MTR SLVR 16FR STAT (SET/KITS/TRAYS/PACK) ×2 IMPLANT
WATER STERILE IRR 1000ML POUR (IV SOLUTION) ×2 IMPLANT

## 2021-10-16 NOTE — Op Note (Signed)
10/16/2021  12:43 PM  PATIENT:  Marcus Harper  71 y.o. 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 Decompressive lumbar laminectomy, hemi facetectomy and foraminotomies L4-5 and L5-S1 requiring more work than would be required for a simple exposure of the disk for PLIF in order to adequately decompress the neural elements and address the spinal stenosis 2. Posterior lumbar interbody fusion L4-5 using PTI interbody cages packed with morcellized allograft and autograft with a right transforaminal interbody fusion L5-S1 utilizing a PTI interbody cage packed with morselized allograft and autograft 3. Posterior fixation L4-S1 inclusive using affect cortical pedicle screws.  4. Intertransverse arthrodesis L4-S1 right using morcellized autograft and allograft.  SURGEON:  Sherley Bounds, MD  ASSISTANTS: Glenford Peers FNP  ANESTHESIA:  General  EBL: 150 ml  Total I/O In: 1700 [I.V.:1700] Out: 345 [Urine:195; Blood:150]  BLOOD ADMINISTERED:none  DRAINS: none   INDICATION FOR PROCEDURE: This patient presented with severe recurrent left leg pain.. Imaging revealed recurrent foraminal stenosis L4-5 and 5 S1 on the left after previous decompressive surgery. The patient tried a reasonable attempt at conservative medical measures without relief. I recommended decompression and instrumented fusion to address the stenosis as well as the segmental  instability.  Patient understood the risks, benefits, and alternatives and potential outcomes and wished to proceed.  PROCEDURE DETAILS:  The patient was brought to the operating room. After induction of generalized endotracheal anesthesia the patient was rolled into the prone position on chest rolls and all pressure points were padded. The patient's lumbar region was cleaned and then prepped with DuraPrep and  draped in the usual sterile fashion. Anesthesia was injected and then a dorsal midline incision was made and carried down to the lumbosacral fascia. The fascia was opened and the paraspinous musculature was taken down in a subperiosteal fashion to expose L4-5 and L5-S1 bilaterally. A self-retaining retractor was placed. Intraoperative fluoroscopy confirmed my level, and I started with placement of the L4 cortical pedicle screws. The pedicle screw entry zones were identified utilizing surface landmarks and  AP and lateral fluoroscopy. I scored the cortex with the high-speed drill and then used the hand drill to drill an upward and outward direction into the pedicle. I then tapped line to line. I then placed a 6.5 x 40 mm cortical pedicle screw into the pedicles of L4 bilaterally.    I then turned my attention to the decompression and complete lumbar laminectomies, hemi- facetectomies, and foraminotomies were performed at L4-5 and L5-S1.  My nurse practitioner was directly involved in the decompression and exposure of the neural elements.  The decompression was very difficult because of the previous surgery and the scar tissue and the spondylosis.  The patient had significant spinal stenosis and this required more work than would be required for a simple exposure of the disc for posterior lumbar interbody fusion which would only require a limited laminotomy. Much more generous decompression and generous foraminotomy was undertaken in order to adequately decompress the neural elements and address the patient's leg pain. The yellow ligament was removed to expose the underlying dura and nerve roots, and generous foraminotomies were performed to adequately decompress the neural elements. Both the exiting and traversing nerve roots were decompressed on both sides until a coronary dilator passed easily along the nerve roots. Once the decompression was complete, I turned my attention to the posterior lumbar interbody  fusion. The  epidural venous vasculature was coagulated and cut sharply. Disc space was incised and the initial discectomy was performed with pituitary rongeurs. The disc space was distracted with sequential distractors to a height of 10 mm at L4-5.  At L5-S1 there is a large bridging anterior osteophyte and even after facetectomy there simply was no motion at this level.  The disc base did not distract open.  So at this level we decided to perform a transforaminal lumbar interbody fusion from the right-hand side.  We did not feel that we could get a good cage and on the left.  We then used a series of scrapers and shavers to prepare the endplates for fusion. The midline was prepared with Epstein curettes. Once the complete discectomy was finished, we packed an appropriate sized interbody cage with local autograft and morcellized allograft, gently retracted the nerve root, and tapped the cage into position at L4-5 bilaterally and L5-S1 from the right.  The midline between the cages was packed with morselized autograft and allograft.   We then turned our attention to the placement of the lower pedicle screws. The pedicle screw entry zones were identified utilizing surface landmarks and fluoroscopy. I drilled into each pedicle utilizing the hand drill, and tapped each pedicle with the appropriate tap. We palpated with a ball probe to assure no break in the cortex. We then placed 6.5 x 40 mm pedicle screws into the pedicles bilaterally at L5 and S1 bilaterally.  My nurse practitioner assisted in placement of the pedicle screws.  We then decorticated the transverse processes and laid a mixture of morcellized autograft and allograft out over these to perform intertransverse arthrodesis at L4-S1 on the right. We then placed lordotic rods into the multiaxial screw heads of the pedicle screws and locked these in position with the locking caps and anti-torque device. We then checked our construct with AP and lateral  fluoroscopy. Irrigated with copious amounts of bacitracin-containing saline solution. Inspected the nerve roots once again to assure adequate decompression, lined to the dura with Gelfoam,  and then we closed the muscle and the fascia with 0 Vicryl. Closed the subcutaneous tissues with 2-0 Vicryl and subcuticular tissues with 3-0 Vicryl. The skin was closed with benzoin and Steri-Strips. Dressing was then applied, the patient was awakened from general anesthesia and transported to the recovery room in stable condition. At the end of the procedure all sponge, needle and instrument counts were correct.   PLAN OF CARE: admit to inpatient  PATIENT DISPOSITION:  PACU - hemodynamically stable.   Delay start of Pharmacological VTE agent (>24hrs) due to surgical blood loss or risk of bleeding:  yes

## 2021-10-16 NOTE — H&P (Signed)
Subjective: Patient is a 71 y.o. male admitted for plif. Onset of symptoms was several months ago, gradually worsening since that time.  The pain is rated severe, and is located at the across the lower back and radiates to LLE. The pain is described as aching and occurs all day. The symptoms have been progressive. Symptoms are exacerbated by exercise, standing, and walking for more than a few minutes. 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 Bovine in 2007. TAVR 10/26/17   BLADDER SUSPENSION     CARDIAC CATHETERIZATION     COLONOSCOPY     KNEE ARTHROSCOPY     meniscus removed, pt unsure of which side   LUMBAR LAMINECTOMY/DECOMPRESSION MICRODISCECTOMY Left 04/20/2021   Procedure: Laminectomy and Foraminotomy - Lumbar five-sacral one - left, Lumbar Lumbar four- five extraforaminal decompression;  Surgeon: Eustace Moore, MD;  Location: [facility];  Service: Neurosurgery;  Laterality: Left;  3C   MEDIASTERNOTOMY     for 25 mm bioprosthetic AVR & replacement of ascending aortic aneurysm at [facility] in [place] on 08/19/2006   MULTIPLE TOOTH EXTRACTIONS     PACEMAKER INSERTION  11/02/2017   PROSTATECTOMY  2003   TONSILLECTOMY     and adenoidectomy as a child   URETHRAL SLING  07/23/2013   pt has had this done twice   VASECTOMY      Prior to Admission medications   Medication Sig Start Date End  Date Taking? Authorizing Provider  acetaminophen (TYLENOL) 500 MG tablet Take 500-1,000 mg by mouth every 6 (six) hours as needed (pain.).   Yes [provider]  amLODipine (NORVASC) 5 MG tablet Take 5 mg by mouth in the morning.   Yes [provider]  aspirin 81 MG EC tablet Take 81 mg by mouth daily.   Yes [provider]  atorvastatin (LIPITOR) 80 MG tablet Take 80 mg by mouth in the morning. 10/16/20 10/16/21 Yes [provider]  escitalopram (LEXAPRO) 5 MG tablet Take 5 mg by mouth in the morning. 09/10/21  Yes [provider]  gabapentin (NEURONTIN) 300 MG capsule Take 300 mg by mouth in the morning, at noon, and at bedtime. 10/07/21  Yes [provider]  hydrochlorothiazide (HYDRODIURIL) 25 MG tablet Take 25 mg by mouth in the morning.   Yes [provider]  latanoprost (XALATAN) 0.005 % ophthalmic solution Place 1 drop into both eyes at bedtime. 03/18/21  Yes [provider]  lisinopril (ZESTRIL) 40 MG tablet Take 40 mg by mouth in the morning. 02/16/21  Yes [provider]  metoprolol succinate (TOPROL-XL) 25 MG 24 hr tablet Take 25 mg by mouth at bedtime.   Yes [provider]  omeprazole (PRILOSEC) 20 MG capsule Take 20  mg by mouth in the morning. 02/03/20  Yes [provider]  traZODone (DESYREL) 100 MG tablet Take 100 mg by mouth at bedtime. 09/10/21  Yes [provider]  Camphor-Menthol-Methyl Sal (SALONPAS) 3.09-11-08 % PTCH Place 1 patch onto the skin daily as needed (back pain.).    [provider]  meloxicam (MOBIC) 15 MG tablet Take 15 mg by mouth in the morning.    [provider]   Allergies  Allergen Reactions   Penicillins Other (See Comments)    Childhood not sure of reaction Has tolerated cefazolin per [facility]     Social History   Tobacco Use   Smoking status: Former    Years: 25.00    Types: Cigarettes    Quit date: 1982    Years since quitting: 41.1    Smokeless tobacco: Never  Substance Use Topics   Alcohol use: Not Currently    Family History  Problem Relation Age of Onset   Heart failure Mother    Cancer Father      Review of Systems  Positive ROS: neg  All other systems have been reviewed and were otherwise negative with the exception of those mentioned in the HPI and as above.  Objective: Vital signs in last 24 hours: Temp:  [98.2 F (36.8 C)] 98.2 F (36.8 C) (02/10 0618) Pulse Rate:  [60] 60 (02/10 0618) Resp:  [17] 17 (02/10 0618) BP: (138)/(72) 138/72 (02/10 0618) SpO2:  [97 %] 97 % (02/10 0618) Weight:  [90.3 kg] 90.3 kg (02/10 0618)  General Appearance: Alert, cooperative, no distress, appears stated age Head: Normocephalic, without obvious abnormality, atraumatic Eyes: PERRL, conjunctiva/corneas clear, EOM's intact    Neck: Supple, symmetrical, trachea midline Back: Symmetric, no curvature, ROM normal, no CVA tenderness Lungs:  respirations unlabored Heart: Regular rate and rhythm Abdomen: Soft, non-tender Extremities: Extremities normal, atraumatic, no cyanosis or edema Pulses: 2+ and symmetric all extremities Skin: Skin color, texture, turgor normal, no rashes or lesions  NEUROLOGIC:   Mental status: Alert and oriented x4,  no aphasia, good attention span, fund of knowledge, and memory Motor Exam - grossly normal Sensory Exam - grossly normal Reflexes: 1+ Coordination - grossly normal Gait - grossly normal Balance - grossly normal Cranial Nerves: I: smell Not tested  II: visual acuity  OS: nl    OD: nl  II: visual fields Full to confrontation  II: pupils Equal, round, reactive to light  III,VII: ptosis None  III,IV,VI: extraocular muscles  Full ROM  V: mastication Normal  V: facial light touch sensation  Normal  V,VII: corneal reflex  Present  VII: facial muscle function - upper  Normal  VII: facial muscle function - lower Normal  VIII: hearing Not tested  IX: soft palate elevation  Normal   IX,X: gag reflex Present  XI: trapezius strength  5/5  XI: sternocleidomastoid strength 5/5  XI: neck flexion strength  5/5  XII: tongue strength  Normal    Data Review Lab Results  Component Value Date   WBC 13.1 (H) 10/14/2021   HGB 14.3 10/14/2021   HCT 42.9 10/14/2021   MCV 95.8 10/14/2021   PLT 240 10/14/2021   Lab Results  Component Value Date   NA 136 10/14/2021   K 4.3 10/14/2021   CL 98 10/14/2021   CO2 27 10/14/2021   BUN 21 10/14/2021   CREATININE 0.97 10/14/2021   GLUCOSE 119 (H) 10/14/2021   Lab Results  Component Value Date   INR 1.0 10/14/2021  Assessment/Plan:  Estimated body mass index is 29.39 kg/m as calculated from the following:   Height as of this encounter: 5\' 9"  (1.753 m).   Weight as of this encounter: 90.3 kg. Patient admitted for PLIF L4-5 L5-S1. Patient has failed a reasonable attempt at conservative therapy.  I explained the condition and procedure to the patient and answered any questions.  Patient wishes to proceed with procedure as planned. Understands risks/ benefits and typical outcomes of procedure.   Eustace Moore 10/16/2021 7:30 AM

## 2021-10-16 NOTE — Anesthesia Postprocedure Evaluation (Signed)
Anesthesia Post Note  Patient: Marcus Harper  Procedure(s) Performed: LUMBAR FOUR-FIVE, LUMBAR FIVE-SACRAL ONE POSTERIOR LUMBAR INTERBODY FUSION (Spine Lumbar)     Patient location during evaluation: PACU Anesthesia Type: General Level of consciousness: awake Pain management: pain level controlled Vital Signs Assessment: post-procedure vital signs reviewed and stable Respiratory status: spontaneous breathing Postop Assessment: no backache Anesthetic complications: no   No notable events documented.  Last Vitals:  Vitals:   10/16/21 1330 10/16/21 1345  BP: (!) 119/56 123/61  Pulse: 79 78  Resp: 12 12  Temp:    SpO2: 96% 94%    Last Pain:  Vitals:   10/16/21 1345  TempSrc:   PainSc: 4     LLE Motor Response: Purposeful movement;Responds to commands (10/16/21 1345) LLE Sensation: Full sensation (10/16/21 1345) RLE Motor Response: Purposeful movement;Responds to commands (10/16/21 1345) RLE Sensation: Full sensation (10/16/21 1345)      Marlena Barbato

## 2021-10-16 NOTE — Anesthesia Procedure Notes (Signed)
Procedure Name: Intubation Date/Time: 10/16/2021 7:51 AM Performed by: Lieutenant Diego, CRNA Pre-anesthesia Checklist: Patient identified, Emergency Drugs available, Suction available and Patient being monitored Patient Re-evaluated:Patient Re-evaluated prior to induction Oxygen Delivery Method: Circle system utilized Preoxygenation: Pre-oxygenation with 100% oxygen Induction Type: IV induction Ventilation: Mask ventilation without difficulty Laryngoscope Size: Miller and 2 Grade View: Grade I Tube type: Oral Tube size: 7.5 mm Number of attempts: 1 Airway Equipment and Method: Stylet and Oral airway Placement Confirmation: ETT inserted through vocal cords under direct vision, positive ETCO2 and breath sounds checked- equal and bilateral Secured at: 22 cm Tube secured with: Tape Dental Injury: Teeth and Oropharynx as per pre-operative assessment

## 2021-10-16 NOTE — Transfer of Care (Signed)
Immediate Anesthesia Transfer of Care Note  Patient: Marcus Harper  Procedure(s) Performed: LUMBAR FOUR-FIVE, LUMBAR FIVE-SACRAL ONE POSTERIOR LUMBAR INTERBODY FUSION (Spine Lumbar)  Patient Location: PACU  Anesthesia Type:General  Level of Consciousness: awake  Airway & Oxygen Therapy: Patient Spontanous Breathing and Patient connected to face mask oxygen  Post-op Assessment: Report given to RN and Post -op Vital signs reviewed and stable  Post vital signs: Reviewed and stable  Last Vitals:  Vitals Value Taken Time  BP 114/51 10/16/21 1246  Temp    Pulse 75 10/16/21 1249  Resp 16 10/16/21 1249  SpO2 99 % 10/16/21 1249  Vitals shown include unvalidated device data.  Last Pain:  Vitals:   10/16/21 0702  TempSrc:   PainSc: 7       Patients Stated Pain Goal: 4 (17/47/15 9539)  Complications: No notable events documented.

## 2021-10-16 NOTE — Progress Notes (Signed)
Pharmacy Antibiotic Note  Marcus Harper is a 71 y.o. male admitted on 10/16/2021 for lumbar surgery. Pharmacy has been consulted for vancomycin dosing for surgical prophylaxis. No surgical drain documented. SCr 0.97.  Pre-op vancomycin dose given at 0717 today.  Plan: Vancomycin 1g IV x 1 at 1930 (12hrs post-op) x 1 per protocol with no drain Monitor SCr  Height: 5\' 9"  (175.3 cm) Weight: 90.3 kg (199 lb) IBW/kg (Calculated) : 70.7  Temp (24hrs), Avg:98.8 F (37.1 C), Min:98 F (36.7 C), Max:100.5 F (38.1 C)  Recent Labs  Lab 10/14/21 1430  WBC 13.1*  CREATININE 0.97    Estimated Creatinine Clearance: 77.6 mL/min (by C-G formula based on SCr of 0.97 mg/dL).    Allergies  Allergen Reactions   Penicillins Other (See Comments)    Childhood not sure of reaction Has tolerated cefazolin per [facility]     Arturo Morton, PharmD, BCPS Please check AMION for all [facility] contact numbers Clinical Pharmacist 10/16/2021 2:54 PM

## 2021-10-17 MED ORDER — METHOCARBAMOL 500 MG PO TABS: 500.0000 mg | ORAL_TABLET | Freq: Four times a day (QID) | ORAL | 1 refills | Status: DC | PRN

## 2021-10-17 MED ORDER — OXYCODONE HCL 10 MG PO TABS: 10.0000 mg | ORAL_TABLET | ORAL | 0 refills | Status: DC | PRN

## 2021-10-17 NOTE — Discharge Summary (Signed)
Physician Discharge Summary  Patient ID: Marcus Harper MRN: [id number] DOB/AGE: [birth year] 71 y.o.  Admit date: 10/16/2021 Discharge date: 10/17/2021  Admission Diagnoses: Recurrent lumbar spinal stenosis with radiculopathy   Discharge Diagnoses: Same   Discharged Condition: good  Hospital Course: The patient was admitted on 10/16/2021 and taken to the operating room where the patient underwent posterior lumbar interbody fusion L4-5 L5-S1. The patient tolerated the procedure well and was taken to the recovery room and then to the floor in stable condition. The hospital course was routine. There were no complications. The wound remained clean dry and intact. Pt had appropriate back soreness. No complaints of leg pain or new N/T/W. The patient remained afebrile with stable vital signs, and tolerated a regular diet. The patient continued to increase activities, and pain was well controlled with oral pain medications.   Consults: None  Significant Diagnostic Studies:  Results for orders placed or performed during the hospital encounter of 10/16/21  ABO/Rh  Result Value Ref Range   ABO/RH(D)      A POS Performed at [facility], [place] [address] [place], [place] [address]     DG Lumbar Spine 2-3 Views  Result Date: 10/16/2021 CLINICAL DATA:  L4-S1 lumbar fusion EXAM: LUMBAR SPINE - 2-3 VIEW COMPARISON:  09/15/2021 FINDINGS: Recent radiographs demonstrate 5 non-rib-bearing lumbar type vertebra. Current images demonstrate BILATERAL pedicle screws at L4-L5-S1 with intervening disc prostheses. Bones demineralized. IMPRESSION: Posterior fusion L4-S1. Electronically Signed   By: Lavonia Dana M.D.   On: 10/16/2021 13:39   DG C-Arm 1-60 Min-No Report  Result Date: 10/16/2021 Fluoroscopy was utilized by the requesting physician.  No radiographic interpretation.   DG C-Arm 1-60 Min-No Report  Result Date: 10/16/2021 Fluoroscopy was utilized by the requesting physician.  No radiographic  interpretation.   DG C-Arm 1-60 Min-No Report  Result Date: 10/16/2021 Fluoroscopy was utilized by the requesting physician.  No radiographic interpretation.   DG C-Arm 1-60 Min-No Report  Result Date: 10/16/2021 Fluoroscopy was utilized by the requesting physician.  No radiographic interpretation.   DG C-Arm 1-60 Min-No Report  Result Date: 10/16/2021 Fluoroscopy was utilized by the requesting physician.  No radiographic interpretation.    Antibiotics:  Anti-infectives (From admission, onward)    Start     Dose/Rate Route Frequency Ordered Stop   10/16/21 1930  vancomycin (VANCOCIN) IVPB 1000 mg/200 mL premix        1,000 mg 200 mL/hr over 60 Minutes Intravenous  Once 10/16/21 1456 10/16/21 1838   10/16/21 0600  vancomycin (VANCOCIN) IVPB 1000 mg/200 mL premix        1,000 mg 200 mL/hr over 60 Minutes Intravenous On call to O.R. 10/16/21 0551 10/16/21 0817       Discharge Exam: Blood pressure 125/73, pulse 78, temperature 98.6 F (37 C), temperature source Oral, resp. rate 16, height 5\' 9"  (1.753 m), weight 90.3 kg, SpO2 98 %. Neurologic: Grossly normal Dressing dry  Discharge Medications:   Allergies as of 10/17/2021       Reactions   Penicillins Other (See Comments)   Childhood not sure of reaction Has tolerated cefazolin per [facility]        Medication List     TAKE these medications    acetaminophen 500 MG tablet Commonly known as: TYLENOL Take 500-1,000 mg by mouth every 6 (six) hours as needed (pain.).   amLODipine 5 MG tablet Commonly known as: NORVASC Take 5 mg by mouth in the morning.   aspirin 81 MG  EC tablet Take 81 mg by mouth daily.   atorvastatin 80 MG tablet Commonly known as: LIPITOR Take 80 mg by mouth in the morning.   escitalopram 5 MG tablet Commonly known as: LEXAPRO Take 5 mg by mouth in the morning.   gabapentin 300 MG capsule Commonly known as: NEURONTIN Take 300 mg by mouth in the morning, at noon, and at bedtime.    hydrochlorothiazide 25 MG tablet Commonly known as: HYDRODIURIL Take 25 mg by mouth in the morning.   latanoprost 0.005 % ophthalmic solution Commonly known as: XALATAN Place 1 drop into both eyes at bedtime.   lisinopril 40 MG tablet Commonly known as: ZESTRIL Take 40 mg by mouth in the morning.   meloxicam 15 MG tablet Commonly known as: MOBIC Take 15 mg by mouth in the morning.   methocarbamol 500 MG tablet Commonly known as: ROBAXIN Take 1 tablet (500 mg total) by mouth every 6 (six) hours as needed for muscle spasms.   metoprolol succinate 25 MG 24 hr tablet Commonly known as: TOPROL-XL Take 25 mg by mouth at bedtime.   omeprazole 20 MG capsule Commonly known as: PRILOSEC Take 20 mg by mouth in the morning.   Oxycodone HCl 10 MG Tabs Take 1 tablet (10 mg total) by mouth every 4 (four) hours as needed for severe pain ((score 7 to 10)).   Salonpas 3.09-11-08 % Ptch Generic drug: Camphor-Menthol-Methyl Sal Place 1 patch onto the skin daily as needed (back pain.).   traZODone 100 MG tablet Commonly known as: DESYREL Take 100 mg by mouth at bedtime.               Durable Medical Equipment  (From admission, onward)           Start     Ordered   10/16/21 1419  DME Walker rolling  Once       Question:  Patient needs a walker to treat with the following condition  Answer:  S/P lumbar fusion   10/16/21 1418   10/16/21 1419  DME 3 n 1  Once        10/16/21 1418            Disposition: Home   Final Dx: Posterior lumbar interbody fusion L4-5 L5-S1  Discharge Instructions      Remove dressing in 72 hours   Complete by: As directed    Call MD for:  difficulty breathing, headache or visual disturbances   Complete by: As directed    Call MD for:  persistant nausea and vomiting   Complete by: As directed    Call MD for:  redness, tenderness, or signs of infection (pain, swelling, redness, odor or green/yellow discharge around incision site)   Complete  by: As directed    Call MD for:  severe uncontrolled pain   Complete by: As directed    Call MD for:  temperature >100.4   Complete by: As directed    Diet - low sodium heart healthy   Complete by: As directed    Increase activity slowly   Complete by: As directed           Signed: Eustace Moore 10/17/2021, 9:00 AM

## 2021-10-17 NOTE — Progress Notes (Signed)
Patient awaiting transport via wheelchair by volunteer for discharge home; in no acute distress nor complaints of pain nor discomfort; incision on his back with honeycomb dressing and is clean, dry and intact; room was checked and accounted for all his belongings; discharge instructions concerning his medications, incision care, follow up appointment and when to call the doctor as needed were all discussed with patient by RN and he expressed understanding on the instructions given.

## 2021-10-17 NOTE — TOC Transition Note (Signed)
Transition of Care [facility]) - CM/SW Discharge Note   Patient Details  Name: Tee Richeson MRN: [id number] Date of Birth: 06/11/1951  Transition of Care [facility]) CM/SW Contact:  Bartholomew Crews, RN Phone Number: [phone] 10/17/2021, 9:50 AM   Clinical Narrative:     Patient to transition home today. No TOC needs identified at this time.   Final next level of care: Home/Self Care Barriers to Discharge: No Barriers Identified   Patient Goals and CMS Choice        Discharge Placement                       Discharge Plan and Services                                     Social Determinants of Health (SDOH) Interventions     Readmission Risk Interventions No flowsheet data found.

## 2021-10-17 NOTE — Evaluation (Signed)
Physical Therapy Evaluation and Discharge Patient Details Name: Marcus Harper MRN: [id number] DOB: September 26, 1950 Today's Date: 10/17/2021  History of Present Illness  Pt is a 71 y/o male who presents s/p L4-L5 PLIF and L5-S1 TLIF on 10/16/2021. 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  Clinical Impression  Patient evaluated by Physical Therapy with no further acute PT needs identified. All education has been completed and the patient has no further questions. Pt was able to demonstrate transfers and ambulation with gross modified independence and no AD. Pt was educated on precautions, brace application/wearing schedule, appropriate activity progression, and car transfer. See below for any follow-up Physical Therapy or equipment needs. PT is signing off. Thank you for this referral.        Recommendations for follow up therapy are one component of a multi-disciplinary discharge planning process, led by the attending physician.  Recommendations may be updated based on patient status, additional functional criteria and insurance authorization.  Follow Up Recommendations No PT follow up    Assistance Recommended at Discharge PRN  Patient can return home with the following  Assist for transportation;Help with stairs or ramp for entrance    Equipment Recommendations None recommended by PT  Recommendations for Other Services       Functional Status Assessment Patient has had a recent decline in their functional status and demonstrates the ability to make significant improvements in function in a reasonable and predictable amount of time.     Precautions / Restrictions  Precautions Precautions: Back;Fall Precaution Booklet Issued: Yes (comment) Required Braces or Orthoses: Spinal Brace Spinal Brace: Lumbar corset;Applied in sitting position Restrictions Weight Bearing Restrictions: No      Mobility  Bed Mobility Overal bed mobility: Modified Independent             General bed mobility comments: HOB elevated and rails lowered to simulate home environment. No assist required for pt to perform log roll.    Transfers Overall transfer level: Modified independent Equipment used: None               General transfer comment: Pt demonstrated proper hand placement on seated surface for safety.    Ambulation/Gait Ambulation/Gait assistance: Modified independent (Device/Increase time) Gait Distance (Feet): 400 Feet Assistive device: None Gait Pattern/deviations: Step-through pattern, Decreased stride length, Trunk flexed Gait velocity: Mildly decreased Gait velocity interpretation: 1.31 - 2.62 ft/sec, indicative of limited community ambulator   General Gait Details: Generally steady with good posture. Occasional cues for  Stairs Stairs: Yes Stairs assistance: Supervision Stair Management: One rail Left, Alternating pattern, Forwards Number of Stairs: 10 General stair comments: VC's for general safety. Pt tolerated well without difficulty.  Wheelchair Mobility    Modified Rankin (Stroke Patients Only)       Balance Overall balance assessment: No apparent balance deficits (not formally assessed)                                           Pertinent Vitals/Pain Pain Assessment Pain Assessment: 0-10 Pain Score: 1  Pain Location: Incision site Pain Descriptors / Indicators: Operative site guarding Pain Intervention(s): Limited activity within patient's tolerance, Monitored during session, Repositioned    Home Living Family/patient expects to be discharged to:: Private  residence Living Arrangements:  Spouse/significant other Available Help at Discharge: Family;Available 24 hours/day Type of Home: House Home Access: Level entry     Alternate Level Stairs-Number of Steps: 16 Home Layout: Two level;Bed/bath upstairs Home Equipment: Grab bars - tub/shower      Prior Function Prior Level of Function : Independent/Modified Independent                     Hand Dominance        Extremity/Trunk Assessment   Upper Extremity Assessment Upper Extremity Assessment: Overall WFL for tasks assessed    Lower Extremity Assessment Lower Extremity Assessment: Overall WFL for tasks assessed (Mild weakness consistent with pre-op diagnosis however still WLF)    Cervical / Trunk Assessment Cervical / Trunk Assessment: Back Surgery  Communication   Communication: No difficulties  Cognition Arousal/Alertness: Awake/alert Behavior During Therapy: WFL for tasks assessed/performed Overall Cognitive Status: Within Functional Limits for tasks assessed                                          General Comments      Exercises     Assessment/Plan    PT Assessment Patient does not need any further PT services  PT Problem List         PT Treatment Interventions      PT Goals (Current goals can be found in the Care Plan section)  Acute Rehab PT Goals Patient Stated Goal: stay pain free PT Goal Formulation: All assessment and education complete, DC therapy    Frequency       Co-evaluation               AM-PAC PT "6 Clicks" Mobility  Outcome Measure Help needed turning from your back to your side while in a flat bed without using bedrails?: None Help needed moving from lying on your back to sitting on the side of a flat bed without using bedrails?: None Help needed moving to and from a bed to a chair (including a wheelchair)?: None Help needed standing up from a chair using your arms (e.g., wheelchair or bedside chair)?: None Help needed to walk in  hospital room?: None Help needed climbing 3-5 steps with a railing? : A Little 6 Click Score: 23    End of Session Equipment Utilized During Treatment: Gait belt Activity Tolerance: Patient tolerated treatment well Patient left: in bed;with call bell/phone within reach Nurse Communication: Mobility status PT Visit Diagnosis: Unsteadiness on feet (R26.81);Pain Pain - part of body:  (back)    Time: 2330-0762 PT Time Calculation (min) (ACUTE ONLY): 16 min   Charges:   PT Evaluation $PT Eval Low Complexity: 1 Low          Rolinda Roan, PT, DPT Acute Rehabilitation Services Pager: [phone] Office: [phone]   Thelma Comp 10/17/2021, 10:22 AM

## 2021-10-17 NOTE — Evaluation (Signed)
Occupational Therapy Evaluation Patient Details Name: Marcus Harper MRN: [id number] DOB: 29-Nov-1950 Today's Date: 10/17/2021   History of Present Illness Pt is a 71 y/o male who presents s/p L4-L5 PLIF and L5-S1 TLIF on 10/16/2021. 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   Clinical Impression   Pt typically indpendent in ADL and mobility. Works, drives, Dance movement psychotherapist, enjoys active lifestyle and being with granddaughters/family. Today Pt is overall supervision for all aspects of ADL. Able to demonstrate transfers (mod I) upper and lower body dressing.  Able to perform standing grooming at sink - education for compensatory strategies completed. Educated on strategies for peri care. Please see ADL section below about further education and performance levels during session. Pt very pleasant, cooperative, and eager for education/instruction throughout. He had no questions or concerns at the end of the session, OT education complete and OT will sign off at this time.      Recommendations for follow up therapy are one component of a multi-disciplinary discharge planning process, led by the attending physician.  Recommendations may be updated based on patient status, additional functional criteria and insurance authorization.   Follow Up Recommendations  No OT follow up    Assistance Recommended at Discharge PRN  Patient can return home with the following Assist for transportation    Functional Status Assessment  Patient has had a recent decline in their functional status and demonstrates the ability to make significant improvements in function in a reasonable and predictable  amount of time.  Equipment Recommendations  None recommended by OT    Recommendations for Other Services       Precautions / Restrictions Precautions Precautions: Back;Fall Precaution Booklet Issued: Yes (comment) Precaution Comments: reviewed in full Required Braces or Orthoses: Spinal Brace Spinal Brace: Lumbar corset;Applied in sitting position Restrictions Weight Bearing Restrictions: No      Mobility Bed Mobility Overal bed mobility: Modified Independent             General bed mobility comments: HOB elevated and rails lowered to simulate home environment. No assist required for pt to perform log roll.    Transfers Overall transfer level: Modified independent Equipment used: None               General transfer comment: Pt demonstrated proper hand placement on seated surface for safety.      Balance Overall balance assessment: No apparent balance deficits (not formally assessed)                                         ADL either performed or assessed with clinical judgement   ADL Overall ADL's : Needs assistance/impaired Eating/Feeding: Independent   Grooming: Oral care;Wash/dry face;Supervision/safety;Standing Grooming Details (indicate cue type and reason): sink level, implementing compensatory strategies for grooming with min cues Upper Body Bathing: Modified independent;Standing Upper Body Bathing Details (indicate cue type and reason): asked RN to follow up on when Pt can bathe, educated to pat, no scrub incision sit once cleared for shower Lower Body Bathing: Modified independent;Sitting/lateral leans Lower Body Bathing Details (indicate cue type and reason): able to complete figure 4 to access Bil feet from sitting Upper Body Dressing : Supervision/safety;Sitting Upper Body Dressing Details (indicate cue type and  reason): able to don/doff brace as well as shirt Lower Body Dressing: Supervision/safety;Sitting/lateral  leans;Adhering to back precautions Lower Body Dressing Details (indicate cue type and reason): able to demonstrate figure 4 method for socks/shoes/pants Toilet Transfer: Supervision/safety   Toileting- Clothing Manipulation and Hygiene: Min guard;Cueing for compensatory techniques;Cueing for back precautions;Sit to/from stand Toileting - Clothing Manipulation Details (indicate cue type and reason): educated on compensatory strategies to prevent twisting for rear peri care Tub/ Shower Transfer: Min guard;Ambulation   Functional mobility during ADLs: Supervision/safety General ADL Comments: pt required min cues throughout session to maintain back precautions     Vision Baseline Vision/History: 1 Wears glasses Ability to See in Adequate Light: 0 Adequate Patient Visual Report: No change from baseline Vision Assessment?: No apparent visual deficits     Perception     Praxis      Pertinent Vitals/Pain Pain Assessment Pain Assessment: 0-10 Pain Score: 1  Pain Location: Incision site Pain Descriptors / Indicators: Operative site guarding Pain Intervention(s): Monitored during session, Repositioned     Hand Dominance     Extremity/Trunk Assessment Upper Extremity Assessment Upper Extremity Assessment: Overall WFL for tasks assessed   Lower Extremity Assessment Lower Extremity Assessment: Defer to PT evaluation   Cervical / Trunk Assessment Cervical / Trunk Assessment: Back Surgery   Communication Communication Communication: No difficulties   Cognition Arousal/Alertness: Awake/alert Behavior During Therapy: WFL for tasks assessed/performed Overall Cognitive Status: Within Functional Limits for tasks assessed                                       General Comments  Pt very pleasant, likes jokes    Exercises     Shoulder Instructions      Home Living Family/patient expects to be discharged to:: Private residence Living Arrangements: Spouse/significant  other Available Help at Discharge: Family;Available 24 hours/day Type of Home: House Home Access: Level entry     Home Layout: Two level;Bed/bath upstairs Alternate Level Stairs-Number of Steps: 16   Bathroom Shower/Tub: Tub/shower unit;Walk-in shower   Bathroom Toilet: Standard     Home Equipment: Grab bars - tub/shower          Prior Functioning/Environment Prior Level of Function : Independent/Modified Independent;Working/employed;Driving;Other (comment) (personal trainer, enjoys lifting weights and being fit)                        OT Problem List: Decreased range of motion;Decreased safety awareness;Decreased knowledge of use of DME or AE;Pain      OT Treatment/Interventions:      OT Goals(Current goals can be found in the care plan section) Acute Rehab OT Goals Patient Stated Goal: to get back to 100% and lifting OT Goal Formulation: With patient Time For Goal Achievement: 10/31/21 Potential to Achieve Goals: Good  OT Frequency:      Co-evaluation              AM-PAC OT "6 Clicks" Daily Activity     Outcome Measure Help from another person eating meals?: None Help from another person taking care of personal grooming?: None Help from another person toileting, which includes using toliet, bedpan, or urinal?: None Help from another person bathing (including washing, rinsing, drying)?: None Help from another person to put on and taking off regular upper body clothing?: None Help from another person to put on and taking off regular lower body clothing?: A  Little 6 Click Score: 23   End of Session Equipment Utilized During Treatment: Back brace Nurse Communication: Mobility status;Precautions (asking about when he can shower once home)  Activity Tolerance: Patient tolerated treatment well Patient left: in bed;Other (comment) (seated on edge of bed fully dressed)  OT Visit Diagnosis: Pain;Muscle weakness (generalized) (M62.81) Pain - Right/Left:   (central) Pain - part of body:  (back)                Time: 4591-3685 OT Time Calculation (min): 31 min Charges:  OT General Charges $OT Visit: 1 Visit OT Evaluation $OT Eval Moderate Complexity: 1 Mod OT Treatments $Self Care/Home Management : 8-22 mins  Jesse Sans OTR/L Acute Rehabilitation Services Pager: [phone] Office: [place] 10/17/2021, 10:31 AM

## 2021-10-20 ENCOUNTER — Encounter (HOSPITAL_COMMUNITY): Payer: Self-pay | Admitting: Neurological Surgery

## 2021-12-02 ENCOUNTER — Other Ambulatory Visit (HOSPITAL_BASED_OUTPATIENT_CLINIC_OR_DEPARTMENT_OTHER): Payer: Self-pay | Admitting: Neurological Surgery

## 2021-12-02 DIAGNOSIS — M5416 Radiculopathy, lumbar region: Secondary | ICD-10-CM

## 2021-12-05 ENCOUNTER — Encounter (HOSPITAL_BASED_OUTPATIENT_CLINIC_OR_DEPARTMENT_OTHER): Payer: Self-pay

## 2021-12-05 ENCOUNTER — Ambulatory Visit (HOSPITAL_BASED_OUTPATIENT_CLINIC_OR_DEPARTMENT_OTHER): Admission: RE | Admit: 2021-12-05 | Payer: Medicare HMO | Source: Ambulatory Visit

## 2021-12-29 ENCOUNTER — Ambulatory Visit (HOSPITAL_COMMUNITY)
Admission: RE | Admit: 2021-12-29 | Discharge: 2021-12-29 | Disposition: A | Discharge status: Home / Self Care | Payer: Medicare HMO | Source: Ambulatory Visit | Attending: Neurological Surgery | Admitting: Neurological Surgery

## 2021-12-29 DIAGNOSIS — M5416 Radiculopathy, lumbar region: Secondary | ICD-10-CM | POA: Insufficient documentation

## 2021-12-29 MED ORDER — GADOBUTROL 1 MMOL/ML IV SOLN
9.0000 mL | Freq: Once | INTRAVENOUS | Status: AC | PRN
  Administered 2021-12-29: 9 mL via INTRAVENOUS

## 2021-12-29 NOTE — Progress Notes (Signed)
[facility] today at [facility] for [facility] lumbar spine w wo contrast. Patient has medtronic device. CLE sent. Orders received for DOO 70. Will re-program once scan is completed.  ?

## 2022-05-24 NOTE — H&P (Signed)
HIP ARTHROPLASTY ADMISSION H&P  Patient ID: Liliana Brentlinger MRN: [id number] DOB/AGE: [birth year] 71 y.o.  Chief Complaint: left hip pain.  Planned Procedure Date: 06/14/22  Medical Clearance by Dr. Donley Redder Dsav Cardiac Clearance by Dr. Oren Bracket Additional clearance by Dr. Emeterio Reeve (oncology)  HPI: Jerson Furukawa is a 71 y.o. male who presents for evaluation of OA LEFT HIP. The patient has a history of pain and functional disability in the left hip due to arthritis and has failed non-surgical conservative treatments for greater than 12 weeks to include NSAID's and/or analgesics, corticosteriod injections, and activity modification.  Onset of symptoms was gradual, starting 1 years ago with rapidlly worsening course since that time. The patient noted no past surgery on the left hip.  Patient currently rates pain at 9 out of 10 with activity. Patient has night pain, worsening of pain with activity and weight bearing, and pain that interferes with activities of daily living.  Patient has evidence of joint space narrowing by imaging studies.  There is no active infection.  Past Medical History:  Diagnosis Date   Anxiety    situation   Aortic aneurysm [facility])    s/p bioprosthetic AVR & replacement of ascending TAA 08/19/06 in [place]; TAVR 10/26/17 for bioprosthetic AS   Arthritis    Asthma    as a child   Cancer ([place]) 2003   prostate   Coronary artery disease    CTO of RCA on LHC in 2019   Depression    Endocarditis 11/2010   s/p 6 weeks IV antibiotics   GERD (gastroesophageal reflux disease)    Hypertension    Presence of permanent cardiac pacemaker    Past Surgical History:  Procedure Laterality Date   AORTIC VALVE REPLACEMENT     valve replaced twice. Bovine in 2007. TAVR 10/26/17   BLADDER SUSPENSION     CARDIAC CATHETERIZATION     COLONOSCOPY     KNEE ARTHROSCOPY     meniscus removed, pt unsure of which side   LUMBAR LAMINECTOMY/DECOMPRESSION MICRODISCECTOMY Left 04/20/2021    Procedure: Laminectomy and Foraminotomy - Lumbar five-sacral one - left, Lumbar Lumbar four- five extraforaminal decompression;  Surgeon: Eustace Moore, MD;  Location: [place];  Service: Neurosurgery;  Laterality: Left;  3C   MEDIASTERNOTOMY     for 25 mm bioprosthetic AVR & replacement of ascending aortic aneurysm at [facility] in [place] on 08/19/2006   MULTIPLE TOOTH EXTRACTIONS     PACEMAKER INSERTION  11/02/2017   PROSTATECTOMY  2003   TONSILLECTOMY     and adenoidectomy as a child   URETHRAL SLING  07/23/2013   pt has had this done twice   VASECTOMY     Allergies  Allergen Reactions   Penicillins Other (See Comments)    Childhood not sure of reaction Has tolerated cefazolin per [facility]    Prior to Admission medications   Medication Sig Start Date End Date Taking? Authorizing Provider  acetaminophen (TYLENOL) 500 MG tablet Take 500-1,000 mg by mouth every 6 (six) hours as needed (pain.).    [provider]  amLODipine (NORVASC) 5 MG tablet Take 5 mg by mouth in the morning.    [provider]  aspirin 81 MG EC tablet Take 81 mg by mouth daily.    [provider]  atorvastatin (LIPITOR) 80 MG tablet Take 80 mg by mouth in the morning. 10/16/20 10/16/21  [provider]  Camphor-Menthol-Methyl Sal (SALONPAS) 3.09-11-08 % PTCH Place 1 patch onto the skin  daily as needed (back pain.).    [provider]  escitalopram (LEXAPRO) 5 MG tablet Take 5 mg by mouth in the morning. 09/10/21   [provider]  gabapentin (NEURONTIN) 300 MG capsule Take 300 mg by mouth in the morning, at noon, and at bedtime. 10/07/21   [provider]  hydrochlorothiazide (HYDRODIURIL) 25 MG tablet Take 25 mg by mouth in the morning.    [provider]  latanoprost (XALATAN) 0.005 % ophthalmic solution Place 1 drop into both eyes at bedtime. 03/18/21   [provider]  lisinopril (ZESTRIL) 40 MG tablet Take 40 mg by mouth in the  morning. 02/16/21   [provider]  meloxicam (MOBIC) 15 MG tablet Take 15 mg by mouth in the morning.    [provider]  methocarbamol (ROBAXIN) 500 MG tablet Take 1 tablet (500 mg total) by mouth every 6 (six) hours as needed for muscle spasms. 10/17/21   Eustace Moore, MD  metoprolol succinate (TOPROL-XL) 25 MG 24 hr tablet Take 25 mg by mouth at bedtime.    [provider]  omeprazole (PRILOSEC) 20 MG capsule Take 20 mg by mouth in the morning. 02/03/20   [provider]  oxyCODONE 10 MG TABS Take 1 tablet (10 mg total) by mouth every 4 (four) hours as needed for severe pain ((score 7 to 10)). 10/17/21   Eustace Moore, MD  traZODone (DESYREL) 100 MG tablet Take 100 mg by mouth at bedtime. 09/10/21   [provider]   Social History   Socioeconomic History   Marital status: Married    Spouse name: Not on file   Number of children: 2   Years of education: Not on file   Highest education level: Not on file  Occupational History   Not on file  Tobacco Use   Smoking status: Former    Years: 25.00    Types: Cigarettes    Quit date: 12    Years since quitting: 41.7   Smokeless tobacco: Never  Vaping Use   Vaping Use: Never used  Substance and Sexual Activity   Alcohol use: Not Currently   Drug use: Yes    Frequency: 7.0 times per week    Types: Marijuana    Comment: every day   Sexual activity: Not on file  Other Topics Concern   Not on file  Social History Narrative   Pt lived in [place], [place]. Moved to [place] around 2008   Social Determinants of Health   Financial Resource Strain: Not on file  Food Insecurity: Not on file  Transportation Needs: Not on file  Physical Activity: Not on file  Stress: Not on file  Social Connections: Not on file   Family History  Problem Relation Age of Onset   Heart failure Mother    Cancer Father     ROS: Currently denies lightheadedness, dizziness, Fever, chills, CP, SOB.   No personal history  of DVT, PE, MI, or CVA. No loose teeth. Patient does have dentures All other systems have been reviewed and were otherwise currently negative with the exception of those mentioned in the HPI and as above.  Objective: Vitals: Ht: '5\' 9"'$  Wt: 195 lbs BP: 141/72 Pulse: 61 O2 97% on room air.   Physical Exam: General: Alert, NAD. Trendelenberg Gait  HEENT: EOMI, Good Neck Extension  Pulm: No increased work of breathing.  Clear B/L A/P w/o crackle or wheeze.  CV: RRR, No m/g/r appreciated  GI: soft,  NT, ND Neuro: Neuro without gross focal deficit.  Sensation intact distally Skin: No lesions in the area of chief complaint MSK/Surgical Site: left Hip pain with passive ROM. Active FF 0-90, 0 deg internal rotation, 15 deg external rotation  Positive Stinchfield.  5/5 strength.  NVI.    Imaging Review Plain radiographs demonstrate severe degenerative joint disease of the left hip.   The bone quality appears to be adequate for age and reported activity level.  Preoperative templating of the joint replacement has been completed, documented, and submitted to the Operating Room personnel in order to optimize intra-operative equipment management.  Assessment: OA LEFT HIP   Plan: Plan for Procedure(s): TOTAL HIP ARTHROPLASTY  The patient history, physical exam, clinical judgement of the provider and imaging are consistent with end stage degenerative joint disease and total joint arthroplasty is deemed medically necessary. The treatment options including medical management, injection therapy, and arthroplasty were discussed at length. The risks and benefits of Procedure(s): TOTAL HIP ARTHROPLASTY were presented and reviewed.  The risks of nonoperative treatment, versus surgical intervention including but not limited to continued pain, aseptic loosening, stiffness, dislocation/subluxation, infection, bleeding, nerve injury, blood clots, cardiopulmonary complications, morbidity, mortality, among others  were discussed. The patient verbalizes understanding and wishes to proceed with the plan.  Patient is being admitted for surgery, pain control, PT, prophylactic antibiotics, VTE prophylaxis, progressive ambulation, ADL's and discharge planning.   Dental prophylaxis discussed and recommended for 2 years postoperatively.  The patient does meet the criteria for TXA which will be used perioperatively.   Eliquis will be used postoperatively for DVT prophylaxis in addition to SCDs, and early ambulation. The patient is planning to be discharged home with OPPT in care of his wife    Jola Baptist 05/24/2022 9:53 AM

## 2022-05-31 NOTE — Progress Notes (Addendum)
Anesthesia Review:  PCP:  [facility]  Dr Noralyn Pick Dsa Clearance 04/08/22 on chart LOV 04/08/22  Cardiologist : Zenia Resides Byrum LOV 04/30/22  Clearance - 02/24/22 on chart  Oncology- DR Emeterio Reeve Clearance dated 03/18/22 on chart  Pacemaker  Have requested clearances from [place] on 06/01/22.   Last Device check- 03/23/22  ORders requested by fax on 06/01/22.   ORders received on 06/09/2022 .  Called [employer] and they will contact rep to notify preop nurse since programming orders require rep to be here.  AS of 500 pm on 06/09/2022 [employer] Rep has not called back  Will renotify on 06/11/2022.   Chest x-ray : EKG : 04/08/22 - requested 12 lead ekg tracing  by fax - confirmation received on 06/01/22. On chart.  Echo : Stress test: Cardiac Cath :  Activity level:  Sleep Study/ CPAP : Fasting Blood Sugar :      / Checks Blood Sugar -- times a day:   Blood Thinner/ Instructions /Last Dose: ASA / Instructions/ Last Dose :   81 mg aspirin  Prediabetes- does not check glucose at home  Hgba1c- 06/01/22 - 5.7  [facility]- AVR- 2003  TAVR- 2021 at [place] for [place] on 06/11/22 due to never returned phone call from 06/09/22.  Leanna Sato ( rep) returned call on 06/11/22 at 1056 am  IVan has covid .  Del will be covering on onday 06/14/2022.  Made aware pt to arrive at 1015am and surgery time of 1251 pm.  - 1522 pm  Leanna Sato aware of periop device orders.  IVan to cal DR Oren Bracket and give preop nurse a call back.   Shawn Stall made aware.   Leanna Sato, [employer] Rep never called preop nurse back on 06/11/2022 in regards to what DR Oren Bracket had said.  [place], [facility] aware,  Called short Stay and psoke with Anneita Minor.  She wll place note on chart in regards to fact that IVan never called back and Del , will be the [employer] Rep to handle per IVan.

## 2022-05-31 NOTE — Patient Instructions (Signed)
SURGICAL WAITING ROOM VISITATION Patients having surgery or a procedure may have no more than 2 support people in the waiting area - these visitors may rotate.   Children under the age of 59 must have an adult with them who is not the patient. If the patient needs to stay at the hospital during part of their recovery, the visitor guidelines for inpatient rooms apply. Pre-op nurse will coordinate an appropriate time for 1 support person to accompany patient in pre-op.  This support person may not rotate.    Please refer to the [facility] website for the visitor guidelines for Inpatients (after your surgery is over and you are in a regular room).       Your procedure is scheduled on:  06/14/2022    Report to [facility] Main Entrance    Report to admitting at   1015AM   Call this number if you have problems the morning of surgery [phone]   Do not eat food :After Midnight.   After Midnight you may have the following liquids until ____ 0945__ AM  DAY OF SURGERY  Water Non-Citrus Juices (without pulp, NO RED) Carbonated Beverages Black Coffee (NO MILK/CREAM OR CREAMERS, sugar ok)  Clear Tea (NO MILK/CREAM OR CREAMERS, sugar ok) regular and decaf                             Plain Jell-O (NO RED)                                           Fruit ices (not with fruit pulp, NO RED)                                     Popsicles (NO RED)                                                               Sports drinks like Gatorade (NO RED)                    The day of surgery:  Drink ONE (1) Pre-Surgery Clear Ensure or G2 at  0945  AM ( have completed by )  the morning of surgery. Drink in one sitting. Do not sip.  This drink was given to you during your hospital  pre-op appointment visit. Nothing else to drink after completing the  Pre-Surgery Clear Ensure or G2.          If you have questions, please contact your surgeon's office.       Oral Hygiene is also important to  reduce your risk of infection.                                    Remember - BRUSH YOUR TEETH THE MORNING OF SURGERY WITH YOUR REGULAR TOOTHPASTE   Do NOT smoke after Midnight   Take these medicines the morning of surgery with A SIP OF WATER:  amlodipine, lexapro, omeprazole, ditropan  DO NOT TAKE ANY ORAL DIABETIC MEDICATIONS DAY OF YOUR SURGERY  Bring CPAP mask and tubing day of surgery.                              You may not have any metal on your body including hair pins, jewelry, and body piercing             Do not wear make-up, lotions, powders, perfumes/cologne, or deodorant  Do not wear nail polish including gel and S&S, artificial/acrylic nails, or any other type of covering on natural nails including finger and toenails. If you have artificial nails, gel coating, etc. that needs to be removed by a nail salon please have this removed prior to surgery or surgery may need to be canceled/ delayed if the surgeon/ anesthesia feels like they are unable to be safely monitored.   Do not shave  48 hours prior to surgery.               Men may shave face and neck.   Do not bring valuables to the hospital. [place].   Contacts, dentures or bridgework may not be worn into surgery.   Bring small overnight bag day of surgery.   DO NOT [place]. PHARMACY WILL DISPENSE MEDICATIONS LISTED ON YOUR MEDICATION LIST TO YOU DURING YOUR ADMISSION [place]!    Patients discharged on the day of surgery will not be allowed to drive home.  Someone NEEDS to stay with you for the first 24 hours after anesthesia.   Special Instructions: Bring a copy of your healthcare power of attorney and living will documents the day of surgery if you haven't scanned them before.              Please read over the following fact sheets you were given: IF [place]  [phone]   If you received a COVID test during your pre-op visit  it is requested that you wear a mask when out in public, stay away from anyone that may not be feeling well and notify your surgeon if you develop symptoms. If you test positive for Covid or have been in contact with anyone that has tested positive in the last 10 days please notify you surgeon.     [place] - Preparing for Surgery Before surgery, you can play an important role.  Because skin is not sterile, your skin needs to be as free of germs as possible.  You can reduce the number of germs on your skin by washing with CHG (chlorahexidine gluconate) soap before surgery.  CHG is an antiseptic cleaner which kills germs and bonds with the skin to continue killing germs even after washing. Please DO NOT use if you have an allergy to CHG or antibacterial soaps.  If your skin becomes reddened/irritated stop using the CHG and inform your nurse when you arrive at Short Stay. Do not shave (including legs and underarms) for at least 48 hours prior to the first CHG shower.  You may shave your face/neck. Please follow these instructions carefully:  1.  Shower with CHG Soap the night before surgery and the  morning of Surgery.  2.  If you choose to wash your hair, wash your hair first as usual  with your  normal  shampoo.  3.  After you shampoo, rinse your hair and body thoroughly to remove the  shampoo.                           4.  Use CHG as you would any other liquid soap.  You can apply chg directly  to the skin and wash                       Gently with a scrungie or clean washcloth.  5.  Apply the CHG Soap to your body ONLY FROM THE NECK DOWN.   Do not use on face/ open                           Wound or open sores. Avoid contact with eyes, ears mouth and genitals (private parts).                       Wash face,  Genitals (private parts) with your normal soap.             6.  Wash thoroughly, paying special attention to the area  where your surgery  will be performed.  7.  Thoroughly rinse your body with warm water from the neck down.  8.  DO NOT shower/wash with your normal soap after using and rinsing off  the CHG Soap.                9.  Pat yourself dry with a clean towel.            10.  Wear clean pajamas.            11.  Place clean sheets on your bed the night of your first shower and do not  sleep with pets. Day of Surgery : Do not apply any lotions/deodorants the morning of surgery.  Please wear clean clothes to the hospital/surgery center.  FAILURE TO FOLLOW THESE INSTRUCTIONS MAY RESULT IN THE CANCELLATION OF YOUR SURGERY PATIENT SIGNATURE_________________________________  NURSE SIGNATURE__________________________________  ________________________________________________________________________

## 2022-06-01 ENCOUNTER — Other Ambulatory Visit: Payer: Self-pay

## 2022-06-01 ENCOUNTER — Encounter (HOSPITAL_COMMUNITY): Payer: Self-pay

## 2022-06-01 ENCOUNTER — Encounter (HOSPITAL_COMMUNITY)
Admission: RE | Admit: 2022-06-01 | Discharge: 2022-06-01 | Disposition: A | Discharge status: Home / Self Care | Payer: Medicare HMO | Source: Ambulatory Visit | Attending: Orthopedic Surgery | Admitting: Orthopedic Surgery

## 2022-06-01 VITALS — BP 145/66 | HR 66 | Temp 98.4°F | Resp 16 | Ht 69.0 in | Wt 196.0 lb

## 2022-06-01 DIAGNOSIS — R7303 Prediabetes: Secondary | ICD-10-CM | POA: Insufficient documentation

## 2022-06-01 DIAGNOSIS — Z01818 Encounter for other preprocedural examination: Secondary | ICD-10-CM | POA: Diagnosis present

## 2022-06-01 HISTORY — DX: Cardiac murmur, unspecified: R01.1

## 2022-06-01 HISTORY — DX: Prediabetes: R73.03

## 2022-06-01 LAB — CBC
HCT: 38.2 % — ABNORMAL LOW (ref 39.0–52.0)
Hemoglobin: 13 g/dL (ref 13.0–17.0)
MCH: 32.9 pg (ref 26.0–34.0)
MCHC: 34 g/dL (ref 30.0–36.0)
MCV: 96.7 fL (ref 80.0–100.0)
Platelets: 242 10*3/uL (ref 150–400)
RBC: 3.95 MIL/uL — ABNORMAL LOW (ref 4.22–5.81)
RDW: 12.8 % (ref 11.5–15.5)
WBC: 7 10*3/uL (ref 4.0–10.5)
nRBC: 0 % (ref 0.0–0.2)

## 2022-06-01 LAB — BASIC METABOLIC PANEL
Anion gap: 8 (ref 5–15)
BUN: 22 mg/dL (ref 8–23)
CO2: 29 mmol/L (ref 22–32)
Calcium: 9.8 mg/dL (ref 8.9–10.3)
Chloride: 99 mmol/L (ref 98–111)
Creatinine, Ser: 0.89 mg/dL (ref 0.61–1.24)
GFR, Estimated: >60 mL/min (ref >60)
Glucose, Bld: 126 mg/dL — ABNORMAL HIGH (ref 70–99)
Potassium: 4.2 mmol/L (ref 3.5–5.1)
Sodium: 136 mmol/L (ref 135–145)

## 2022-06-01 LAB — SURGICAL PCR SCREEN
MRSA, PCR: NEGATIVE
Staphylococcus aureus: NEGATIVE

## 2022-06-01 LAB — GLUCOSE, CAPILLARY: Glucose-Capillary: 117 mg/dL — ABNORMAL HIGH (ref 70–99)

## 2022-06-01 LAB — HEMOGLOBIN A1C
Hgb A1c MFr Bld: 5.7 % — ABNORMAL HIGH (ref 4.8–5.6)
Mean Plasma Glucose: 116.89 mg/dL

## 2022-06-07 NOTE — Anesthesia Preprocedure Evaluation (Addendum)
Anesthesia Evaluation  Patient identified by MRN, date of birth, ID band Patient awake    Reviewed: Allergy & Precautions, NPO status , Patient's Chart, lab work & pertinent test results, reviewed documented beta blocker date and time   History of Anesthesia Complications Negative for: history of anesthetic complications  Airway Mallampati: II  TM Distance: >3 FB Neck ROM: Full    Dental  (+) Edentulous Upper, Edentulous Lower   Pulmonary former smoker,    Pulmonary exam normal        Cardiovascular hypertension, Pt. on home beta blockers and Pt. 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  Reproductive/Obstetrics negative OB ROS                           Anesthesia Physical Anesthesia Plan  ASA: 3  Anesthesia Plan: Spinal   Post-op Pain Management: Tylenol PO (pre-op)*   Induction:   PONV Risk Score and Plan: 1 and Treatment may vary due to age or medical condition, Ondansetron, Propofol infusion and Dexamethasone  Airway Management Planned: Natural Airway and Simple Face Mask  Additional Equipment: None  Intra-op Plan:   Post-operative Plan:   Informed Consent: I have reviewed the patients History and Physical, chart, labs and discussed the procedure including the risks, benefits and alternatives for the proposed anesthesia with the patient or authorized  representative who has indicated his/her understanding and acceptance.       Plan Discussed with: CRNA  Anesthesia Plan Comments: (See PAT note 06/01/2022)      Anesthesia Quick Evaluation

## 2022-06-07 NOTE — Progress Notes (Signed)
Anesthesia Chart Review   Case: [id number] Date/Time: 06/14/22 1236   Procedure: TOTAL HIP ARTHROPLASTY (Left: Hip)   Anesthesia type: Spinal   Pre-op diagnosis: OA LEFT HIP   Location: [facility] ROOM 08 / [facility]   Surgeons: Willaim Sheng, MD       DISCUSSION:71 y.o. former smoker with h/o HTN, chronic LBBB, CAD, s/p TAVR 2019, CHB s/p MDT dual-chamber PPM, left hip OA scheduled for above procedure 06/14/2022 with Dr. Charlies Constable.   Pt last seen by cardiology 04/30/2022. Per OV note, "he is somewhat limited in his physical function due to his hip pain but he has no chest pain or dyspnea --no further cardiac evaluation is warranted prior to his hip surgery"  Seen by PCP 04/08/2022 for preoperative evaluation.   Device orders requested.  VS: BP (!) 145/66   Pulse 66   Temp 36.9 C (Oral)   Resp 16   Ht '5\' 9"'$  (1.753 m)   Wt 88.9 kg   SpO2 98%   BMI 28.94 kg/m   PROVIDERS: Dsa, Mayford Knife, MD is PCP   Byrum, Alphonzo Severance III, MD is Cardiologist  LABS: Labs reviewed: Acceptable for surgery. (all labs ordered are listed, but only abnormal results are displayed)  Labs Reviewed  BASIC METABOLIC PANEL - Abnormal; Notable for the following components:      Result Value   Glucose, Bld 126 (*)    All other components within normal limits  CBC - Abnormal; Notable for the following components:   RBC 3.95 (*)    HCT 38.2 (*)    All other components within normal limits  HEMOGLOBIN A1C - Abnormal; Notable for the following components:   Hgb A1c MFr Bld 5.7 (*)    All other components within normal limits  GLUCOSE, CAPILLARY - Abnormal; Notable for the following components:   Glucose-Capillary 117 (*)    All other components within normal limits  SURGICAL PCR SCREEN     IMAGES:   EKG:   CV: Echo 10/14/2020 SUMMARY  There is normal left ventricular wall thickness.  The left ventricular size is normal.  Left ventricular systolic function is normal.  The right ventricle  is normal size.  The right ventricular systolic function is normal.  Right ventricular device lead noted.  Evolut 70m TAVR valve with trace paravalvular leak. No evidence of  significant prosthetic valve stenosis (peak velocity is 2.5 m/s and  acceleration time is 70 ms).  There was insufficient TR detected to calculate RV systolic pressure.  The IVC is normal in size with an inspiratory collapse of greater then  50%, suggesting normal right atrial pressure.  There is no pericardial effusion.  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 Bovine in 2007. TAVR 10/26/17   BLADDER SUSPENSION  CARDIAC CATHETERIZATION     CIRCUMCISION     COLONOSCOPY     KNEE ARTHROSCOPY     meniscus removed, pt unsure of which side   LUMBAR LAMINECTOMY/DECOMPRESSION MICRODISCECTOMY Left 04/20/2021   Procedure: Laminectomy and Foraminotomy - Lumbar five-sacral one - left, Lumbar Lumbar four- five extraforaminal decompression;  Surgeon: Eustace Moore, MD;  Location: [place];  Service: Neurosurgery;  Laterality: Left;  3C   MEDIASTERNOTOMY     for 25 mm bioprosthetic AVR & replacement of ascending aortic aneurysm at [facility] in [place] on 08/19/2006   MULTIPLE TOOTH EXTRACTIONS     PACEMAKER INSERTION  11/02/2017   PROSTATECTOMY  2003   SPINAL FUSION     2023- Sherley Bounds MD   TONSILLECTOMY     and  adenoidectomy as a child   URETHRAL SLING  07/23/2013   pt has had this done twice   VASECTOMY      MEDICATIONS:  acetaminophen (TYLENOL) 500 MG tablet   amLODipine (NORVASC) 5 MG tablet   Ascorbic Acid (VITAMIN C PO)   aspirin 81 MG EC tablet   atorvastatin (LIPITOR) 80 MG tablet   Cholecalciferol (VITAMIN D3 PO)   ECHINACEA PO   escitalopram (LEXAPRO) 10 MG tablet   escitalopram (LEXAPRO) 5 MG tablet   hydrochlorothiazide (HYDRODIURIL) 25 MG tablet   latanoprost (XALATAN) 0.005 % ophthalmic solution   lisinopril (ZESTRIL) 40 MG tablet   meloxicam (MOBIC) 15 MG tablet   metoprolol succinate (TOPROL-XL) 25 MG 24 hr tablet   omeprazole (PRILOSEC) 20 MG capsule   oxybutynin (DITROPAN-XL) 10 MG 24 hr tablet   oxyCODONE-acetaminophen (PERCOCET) 10-325 MG tablet   polyethylene glycol (MIRALAX / GLYCOLAX) 17 g packet   traZODone (DESYREL) 100 MG tablet   No current facility-administered medications for this encounter.    Marcus Felix Ward, PA-C [facility]-Surgical Testing [phone]

## 2022-06-14 ENCOUNTER — Other Ambulatory Visit: Payer: Self-pay

## 2022-06-14 ENCOUNTER — Observation Stay (HOSPITAL_COMMUNITY)
Admission: RE | Admit: 2022-06-14 | Discharge: 2022-06-15 | Disposition: A | Discharge status: Home / Self Care | Payer: Medicare HMO | Attending: Orthopedic Surgery | Admitting: Orthopedic Surgery

## 2022-06-14 ENCOUNTER — Encounter (HOSPITAL_COMMUNITY)
Admission: RE | Disposition: A | Discharge status: Home / Self Care | Payer: Self-pay | Source: Home / Self Care | Attending: Orthopedic Surgery

## 2022-06-14 ENCOUNTER — Ambulatory Visit (HOSPITAL_BASED_OUTPATIENT_CLINIC_OR_DEPARTMENT_OTHER): Payer: Medicare HMO | Admitting: Certified Registered"

## 2022-06-14 ENCOUNTER — Ambulatory Visit (HOSPITAL_COMMUNITY): Payer: Medicare HMO

## 2022-06-14 ENCOUNTER — Ambulatory Visit (HOSPITAL_COMMUNITY): Payer: Medicare HMO | Admitting: Physician Assistant

## 2022-06-14 ENCOUNTER — Encounter (HOSPITAL_COMMUNITY): Payer: Self-pay | Admitting: Orthopedic Surgery

## 2022-06-14 ENCOUNTER — Observation Stay (HOSPITAL_COMMUNITY): Payer: Medicare HMO

## 2022-06-14 DIAGNOSIS — Q6589 Other specified congenital deformities of hip: Secondary | ICD-10-CM

## 2022-06-14 DIAGNOSIS — Z950 Presence of cardiac pacemaker: Secondary | ICD-10-CM | POA: Diagnosis not present

## 2022-06-14 DIAGNOSIS — Z79899 Other long term (current) drug therapy: Secondary | ICD-10-CM | POA: Diagnosis not present

## 2022-06-14 DIAGNOSIS — J45909 Unspecified asthma, uncomplicated: Secondary | ICD-10-CM | POA: Insufficient documentation

## 2022-06-14 DIAGNOSIS — Z8546 Personal history of malignant neoplasm of prostate: Secondary | ICD-10-CM | POA: Insufficient documentation

## 2022-06-14 DIAGNOSIS — Z01818 Encounter for other preprocedural examination: Secondary | ICD-10-CM

## 2022-06-14 DIAGNOSIS — Z87891 Personal history of nicotine dependence: Secondary | ICD-10-CM | POA: Insufficient documentation

## 2022-06-14 DIAGNOSIS — M1612 Unilateral primary osteoarthritis, left hip: Secondary | ICD-10-CM

## 2022-06-14 DIAGNOSIS — I10 Essential (primary) hypertension: Secondary | ICD-10-CM | POA: Diagnosis not present

## 2022-06-14 DIAGNOSIS — I2510 Atherosclerotic heart disease of native coronary artery without angina pectoris: Secondary | ICD-10-CM | POA: Diagnosis not present

## 2022-06-14 DIAGNOSIS — Z7982 Long term (current) use of aspirin: Secondary | ICD-10-CM | POA: Diagnosis not present

## 2022-06-14 DIAGNOSIS — Z952 Presence of prosthetic heart valve: Secondary | ICD-10-CM | POA: Insufficient documentation

## 2022-06-14 HISTORY — PX: TOTAL HIP ARTHROPLASTY: SHX124

## 2022-06-14 LAB — GLUCOSE, CAPILLARY: Glucose-Capillary: 147 mg/dL — ABNORMAL HIGH (ref 70–99)

## 2022-06-14 SURGERY — ARTHROPLASTY, HIP, TOTAL,POSTERIOR APPROACH
Anesthesia: Spinal | Site: Hip | Laterality: Left

## 2022-06-14 MED ORDER — CHLORHEXIDINE GLUCONATE 0.12 % MT SOLN
15.0000 mL | Freq: Once | OROMUCOSAL | Status: AC
  Administered 2022-06-14: 15 mL via OROMUCOSAL

## 2022-06-14 MED ORDER — HYDROCHLOROTHIAZIDE 25 MG PO TABS
25.0000 mg | ORAL_TABLET | Freq: Every day | ORAL | Status: DC
  Administered 2022-06-15: 25 mg via ORAL
  Filled 2022-06-14: qty 1

## 2022-06-14 MED ORDER — OXYBUTYNIN CHLORIDE ER 5 MG PO TB24
10.0000 mg | ORAL_TABLET | Freq: Every day | ORAL | Status: DC
  Administered 2022-06-15: 10 mg via ORAL
  Filled 2022-06-14: qty 2

## 2022-06-14 MED ORDER — PANTOPRAZOLE SODIUM 40 MG PO TBEC
40.0000 mg | DELAYED_RELEASE_TABLET | Freq: Every day | ORAL | Status: DC
  Administered 2022-06-15: 40 mg via ORAL
  Filled 2022-06-14: qty 1

## 2022-06-14 MED ORDER — AMISULPRIDE (ANTIEMETIC) 5 MG/2ML IV SOLN: 10.0000 mg | Freq: Once | INTRAVENOUS | Status: DC | PRN

## 2022-06-14 MED ORDER — FENTANYL CITRATE (PF) 100 MCG/2ML IJ SOLN
INTRAMUSCULAR | Status: AC
  Filled 2022-06-14: qty 2

## 2022-06-14 MED ORDER — ZOLPIDEM TARTRATE 5 MG PO TABS: 5.0000 mg | ORAL_TABLET | Freq: Every evening | ORAL | Status: DC | PRN

## 2022-06-14 MED ORDER — DEXAMETHASONE SODIUM PHOSPHATE 10 MG/ML IJ SOLN
8.0000 mg | Freq: Once | INTRAMUSCULAR | Status: AC
  Administered 2022-06-14 (×2): 4 mg via INTRAVENOUS

## 2022-06-14 MED ORDER — AMLODIPINE BESYLATE 5 MG PO TABS
5.0000 mg | ORAL_TABLET | Freq: Every day | ORAL | Status: DC
  Administered 2022-06-15: 5 mg via ORAL
  Filled 2022-06-14: qty 1

## 2022-06-14 MED ORDER — CEFAZOLIN SODIUM-DEXTROSE 2-4 GM/100ML-% IV SOLN
2.0000 g | Freq: Four times a day (QID) | INTRAVENOUS | Status: AC
  Administered 2022-06-14 – 2022-06-15 (×2): 2 g via INTRAVENOUS
  Filled 2022-06-14 (×2): qty 100

## 2022-06-14 MED ORDER — LISINOPRIL 20 MG PO TABS
40.0000 mg | ORAL_TABLET | Freq: Every day | ORAL | Status: DC
  Administered 2022-06-15: 40 mg via ORAL
  Filled 2022-06-14: qty 2

## 2022-06-14 MED ORDER — ORAL CARE MOUTH RINSE: 15.0000 mL | Freq: Once | OROMUCOSAL | Status: AC

## 2022-06-14 MED ORDER — EPHEDRINE 5 MG/ML INJ
INTRAVENOUS | Status: AC
  Filled 2022-06-14: qty 5

## 2022-06-14 MED ORDER — MIDAZOLAM HCL 2 MG/2ML IJ SOLN
INTRAMUSCULAR | Status: AC
  Filled 2022-06-14: qty 2

## 2022-06-14 MED ORDER — ACETAMINOPHEN 500 MG PO TABS
1000.0000 mg | ORAL_TABLET | Freq: Four times a day (QID) | ORAL | Status: AC
  Administered 2022-06-14 – 2022-06-15 (×4): 1000 mg via ORAL
  Filled 2022-06-14 (×4): qty 2

## 2022-06-14 MED ORDER — OXYCODONE HCL 5 MG PO TABS
5.0000 mg | ORAL_TABLET | ORAL | Status: DC | PRN
  Administered 2022-06-15: 5 mg via ORAL
  Filled 2022-06-14: qty 1

## 2022-06-14 MED ORDER — KETOROLAC TROMETHAMINE 15 MG/ML IJ SOLN
7.5000 mg | Freq: Four times a day (QID) | INTRAMUSCULAR | Status: AC
  Administered 2022-06-14 – 2022-06-15 (×4): 7.5 mg via INTRAVENOUS
  Filled 2022-06-14 (×4): qty 1

## 2022-06-14 MED ORDER — METHOCARBAMOL 500 MG PO TABS
500.0000 mg | ORAL_TABLET | Freq: Four times a day (QID) | ORAL | Status: DC | PRN
  Administered 2022-06-15: 500 mg via ORAL
  Filled 2022-06-14: qty 1

## 2022-06-14 MED ORDER — ESCITALOPRAM OXALATE 10 MG PO TABS
15.0000 mg | ORAL_TABLET | Freq: Every day | ORAL | Status: DC
  Administered 2022-06-15: 15 mg via ORAL
  Filled 2022-06-14: qty 2

## 2022-06-14 MED ORDER — SODIUM CHLORIDE 0.9 % IR SOLN
Status: DC | PRN
  Administered 2022-06-14: 1000 mL

## 2022-06-14 MED ORDER — MIDAZOLAM HCL 2 MG/2ML IJ SOLN
INTRAMUSCULAR | Status: DC | PRN
  Administered 2022-06-14 (×2): 1 mg via INTRAVENOUS

## 2022-06-14 MED ORDER — OXYCODONE HCL 5 MG PO TABS: 5.0000 mg | ORAL_TABLET | Freq: Once | ORAL | Status: DC | PRN

## 2022-06-14 MED ORDER — LACTATED RINGERS IV SOLN: INTRAVENOUS | Status: DC

## 2022-06-14 MED ORDER — PHENOL 1.4 % MT LIQD: 1.0000 | OROMUCOSAL | Status: DC | PRN

## 2022-06-14 MED ORDER — ISOPROPYL ALCOHOL 70 % SOLN
Status: DC | PRN
  Administered 2022-06-14: 1 via TOPICAL

## 2022-06-14 MED ORDER — METOPROLOL SUCCINATE ER 25 MG PO TB24
25.0000 mg | ORAL_TABLET | Freq: Every day | ORAL | Status: DC
  Administered 2022-06-14: 25 mg via ORAL
  Filled 2022-06-14: qty 1

## 2022-06-14 MED ORDER — ONDANSETRON HCL 4 MG/2ML IJ SOLN
INTRAMUSCULAR | Status: AC
  Filled 2022-06-14: qty 2

## 2022-06-14 MED ORDER — LACTATED RINGERS IV SOLN: INTRAVENOUS | Status: DC | PRN

## 2022-06-14 MED ORDER — PROPOFOL 500 MG/50ML IV EMUL
INTRAVENOUS | Status: AC
  Filled 2022-06-14: qty 50

## 2022-06-14 MED ORDER — PROPOFOL 10 MG/ML IV BOLUS
INTRAVENOUS | Status: DC | PRN
  Administered 2022-06-14: 20 mg via INTRAVENOUS

## 2022-06-14 MED ORDER — HYDROMORPHONE HCL 1 MG/ML IJ SOLN: 0.5000 mg | INTRAMUSCULAR | Status: DC | PRN

## 2022-06-14 MED ORDER — TRAZODONE HCL 100 MG PO TABS
100.0000 mg | ORAL_TABLET | Freq: Every day | ORAL | Status: DC
  Administered 2022-06-14: 100 mg via ORAL
  Filled 2022-06-14: qty 1

## 2022-06-14 MED ORDER — ACETAMINOPHEN 500 MG PO TABS
1000.0000 mg | ORAL_TABLET | Freq: Once | ORAL | Status: AC
  Administered 2022-06-14: 1000 mg via ORAL
  Filled 2022-06-14: qty 2

## 2022-06-14 MED ORDER — POVIDONE-IODINE 7.5 % EX SOLN: Freq: Once | CUTANEOUS | Status: DC

## 2022-06-14 MED ORDER — METHOCARBAMOL 500 MG IVPB - SIMPLE MED: 500.0000 mg | Freq: Four times a day (QID) | INTRAVENOUS | Status: DC | PRN

## 2022-06-14 MED ORDER — ACETAMINOPHEN 325 MG PO TABS: 325.0000 mg | ORAL_TABLET | Freq: Four times a day (QID) | ORAL | Status: DC | PRN

## 2022-06-14 MED ORDER — TRANEXAMIC ACID-NACL 1000-0.7 MG/100ML-% IV SOLN
1000.0000 mg | INTRAVENOUS | Status: AC
  Administered 2022-06-14: 1000 mg via INTRAVENOUS
  Filled 2022-06-14: qty 100

## 2022-06-14 MED ORDER — ONDANSETRON HCL 4 MG/2ML IJ SOLN
INTRAMUSCULAR | Status: DC | PRN
  Administered 2022-06-14: 4 mg via INTRAVENOUS

## 2022-06-14 MED ORDER — DEXAMETHASONE SODIUM PHOSPHATE 10 MG/ML IJ SOLN
INTRAMUSCULAR | Status: AC
  Filled 2022-06-14: qty 1

## 2022-06-14 MED ORDER — BUPIVACAINE LIPOSOME 1.3 % IJ SUSP
INTRAMUSCULAR | Status: AC
  Filled 2022-06-14: qty 20

## 2022-06-14 MED ORDER — OXYCODONE HCL 5 MG/5ML PO SOLN: 5.0000 mg | Freq: Once | ORAL | Status: DC | PRN

## 2022-06-14 MED ORDER — MENTHOL 3 MG MT LOZG: 1.0000 | LOZENGE | OROMUCOSAL | Status: DC | PRN

## 2022-06-14 MED ORDER — PROPOFOL 1000 MG/100ML IV EMUL
INTRAVENOUS | Status: AC
  Filled 2022-06-14: qty 100

## 2022-06-14 MED ORDER — SODIUM CHLORIDE (PF) 0.9 % IJ SOLN
INTRAMUSCULAR | Status: AC
  Filled 2022-06-14: qty 50

## 2022-06-14 MED ORDER — PHENYLEPHRINE HCL-NACL 20-0.9 MG/250ML-% IV SOLN
INTRAVENOUS | Status: DC | PRN
  Administered 2022-06-14: 40 ug/min via INTRAVENOUS

## 2022-06-14 MED ORDER — HYDROCHLOROTHIAZIDE 25 MG PO TABS: 25.0000 mg | ORAL_TABLET | Freq: Every day | ORAL | Status: DC

## 2022-06-14 MED ORDER — ATORVASTATIN CALCIUM 40 MG PO TABS
80.0000 mg | ORAL_TABLET | Freq: Every day | ORAL | Status: DC
  Administered 2022-06-14: 80 mg via ORAL
  Filled 2022-06-14: qty 2

## 2022-06-14 MED ORDER — ONDANSETRON HCL 4 MG/2ML IJ SOLN: 4.0000 mg | Freq: Four times a day (QID) | INTRAMUSCULAR | Status: DC | PRN

## 2022-06-14 MED ORDER — DOCUSATE SODIUM 100 MG PO CAPS
100.0000 mg | ORAL_CAPSULE | Freq: Two times a day (BID) | ORAL | Status: DC
  Administered 2022-06-14 – 2022-06-15 (×2): 100 mg via ORAL
  Filled 2022-06-14 (×2): qty 1

## 2022-06-14 MED ORDER — POLYETHYLENE GLYCOL 3350 17 G PO PACK: 17.0000 g | PACK | Freq: Every day | ORAL | Status: DC | PRN

## 2022-06-14 MED ORDER — BUPIVACAINE LIPOSOME 1.3 % IJ SUSP
INTRAMUSCULAR | Status: DC | PRN
  Administered 2022-06-14: 20 mL

## 2022-06-14 MED ORDER — VITAMIN C 500 MG PO TABS
500.0000 mg | ORAL_TABLET | Freq: Every day | ORAL | Status: DC
  Administered 2022-06-15: 500 mg via ORAL
  Filled 2022-06-14: qty 1

## 2022-06-14 MED ORDER — BUPIVACAINE IN DEXTROSE 0.75-8.25 % IT SOLN
INTRATHECAL | Status: DC | PRN
  Administered 2022-06-14: 2 mL via INTRATHECAL

## 2022-06-14 MED ORDER — LIDOCAINE HCL (PF) 2 % IJ SOLN
INTRAMUSCULAR | Status: AC
  Filled 2022-06-14: qty 15

## 2022-06-14 MED ORDER — WATER FOR IRRIGATION, STERILE IR SOLN
Status: DC | PRN
  Administered 2022-06-14: 2000 mL

## 2022-06-14 MED ORDER — FENTANYL CITRATE PF 50 MCG/ML IJ SOSY
25.0000 ug | PREFILLED_SYRINGE | INTRAMUSCULAR | Status: DC | PRN
  Administered 2022-06-14: 50 ug via INTRAVENOUS

## 2022-06-14 MED ORDER — SENNA 8.6 MG PO TABS
1.0000 | ORAL_TABLET | Freq: Two times a day (BID) | ORAL | Status: DC
  Administered 2022-06-14 – 2022-06-15 (×2): 8.6 mg via ORAL
  Filled 2022-06-14 (×2): qty 1

## 2022-06-14 MED ORDER — ACETAMINOPHEN 500 MG PO TABS: 1000.0000 mg | ORAL_TABLET | Freq: Once | ORAL | Status: DC

## 2022-06-14 MED ORDER — BUPIVACAINE LIPOSOME 1.3 % IJ SUSP: 10.0000 mL | Freq: Once | INTRAMUSCULAR | Status: DC

## 2022-06-14 MED ORDER — LIDOCAINE 2% (20 MG/ML) 5 ML SYRINGE
INTRAMUSCULAR | Status: DC | PRN
  Administered 2022-06-14: 40 mg via INTRAVENOUS

## 2022-06-14 MED ORDER — SODIUM CHLORIDE (PF) 0.9 % IJ SOLN
INTRAMUSCULAR | Status: DC | PRN
  Administered 2022-06-14: 60 mL

## 2022-06-14 MED ORDER — SODIUM CHLORIDE 0.9 % IR SOLN
Status: DC | PRN
  Administered 2022-06-14: 3000 mL

## 2022-06-14 MED ORDER — APIXABAN 2.5 MG PO TABS
2.5000 mg | ORAL_TABLET | Freq: Two times a day (BID) | ORAL | Status: DC
  Administered 2022-06-15: 2.5 mg via ORAL
  Filled 2022-06-14: qty 1

## 2022-06-14 MED ORDER — ONDANSETRON HCL 4 MG PO TABS: 4.0000 mg | ORAL_TABLET | Freq: Four times a day (QID) | ORAL | Status: DC | PRN

## 2022-06-14 MED ORDER — FENTANYL CITRATE PF 50 MCG/ML IJ SOSY
PREFILLED_SYRINGE | INTRAMUSCULAR | Status: AC
  Administered 2022-06-14: 50 ug via INTRAVENOUS
  Filled 2022-06-14: qty 2

## 2022-06-14 MED ORDER — POVIDONE-IODINE 10 % EX SWAB: 2.0000 | Freq: Once | CUTANEOUS | Status: DC

## 2022-06-14 MED ORDER — LATANOPROST 0.005 % OP SOLN
1.0000 [drp] | Freq: Every day | OPHTHALMIC | Status: DC
  Administered 2022-06-14: 1 [drp] via OPHTHALMIC
  Filled 2022-06-14: qty 2.5

## 2022-06-14 MED ORDER — FENTANYL CITRATE (PF) 100 MCG/2ML IJ SOLN
INTRAMUSCULAR | Status: DC | PRN
  Administered 2022-06-14 (×4): 25 ug via INTRAVENOUS

## 2022-06-14 MED ORDER — OXYCODONE HCL 5 MG PO TABS: 10.0000 mg | ORAL_TABLET | ORAL | Status: DC | PRN

## 2022-06-14 MED ORDER — CEFAZOLIN SODIUM-DEXTROSE 2-4 GM/100ML-% IV SOLN
2.0000 g | INTRAVENOUS | Status: AC
  Administered 2022-06-14: 2 g via INTRAVENOUS
  Filled 2022-06-14: qty 100

## 2022-06-14 MED ORDER — METHOCARBAMOL 500 MG IVPB - SIMPLE MED
INTRAVENOUS | Status: AC
  Administered 2022-06-14: 500 mg via INTRAVENOUS
  Filled 2022-06-14: qty 55

## 2022-06-14 MED ORDER — SODIUM CHLORIDE (PF) 0.9 % IJ SOLN
INTRAMUSCULAR | Status: AC
  Filled 2022-06-14: qty 10

## 2022-06-14 MED ORDER — PROPOFOL 500 MG/50ML IV EMUL
INTRAVENOUS | Status: DC | PRN
  Administered 2022-06-14: 40 ug/kg/min via INTRAVENOUS

## 2022-06-14 MED ORDER — DIPHENHYDRAMINE HCL 12.5 MG/5ML PO ELIX: 12.5000 mg | ORAL_SOLUTION | ORAL | Status: DC | PRN

## 2022-06-14 MED ORDER — CELECOXIB 200 MG PO CAPS
400.0000 mg | ORAL_CAPSULE | Freq: Once | ORAL | Status: AC
  Administered 2022-06-14: 400 mg via ORAL
  Filled 2022-06-14: qty 2

## 2022-06-14 MED ORDER — 0.9 % SODIUM CHLORIDE (POUR BTL) OPTIME
TOPICAL | Status: DC | PRN
  Administered 2022-06-14: 1000 mL

## 2022-06-14 MED ORDER — POVIDONE-IODINE 10 % EX SWAB
2.0000 | Freq: Once | CUTANEOUS | Status: AC
  Administered 2022-06-14: 2 via TOPICAL

## 2022-06-14 SURGICAL SUPPLY — 63 items
BAG COUNTER SPONGE SURGICOUNT (BAG) IMPLANT
BAG DECANTER FOR FLEXI CONT (MISCELLANEOUS) ×1 IMPLANT
BAG ZIPLOCK 12X15 (MISCELLANEOUS) ×1 IMPLANT
BLADE SAW SAG 25X90X1.19 (BLADE) ×1 IMPLANT
CHLORAPREP W/TINT 26 (MISCELLANEOUS) ×2 IMPLANT
COVER SURGICAL LIGHT HANDLE (MISCELLANEOUS) ×1 IMPLANT
DERMABOND ADVANCED .7 DNX12 (GAUZE/BANDAGES/DRESSINGS) ×1 IMPLANT
DERMABOND ADVANCED .7 DNX6 (GAUZE/BANDAGES/DRESSINGS) IMPLANT
DRAPE HIP W/POCKET STRL (MISCELLANEOUS) ×1 IMPLANT
DRAPE INCISE IOBAN 66X45 STRL (DRAPES) ×1 IMPLANT
DRAPE INCISE IOBAN 85X60 (DRAPES) ×1 IMPLANT
DRAPE POUCH INSTRU U-SHP 10X18 (DRAPES) ×1 IMPLANT
DRAPE SHEET LG 3/4 BI-LAMINATE (DRAPES) ×3 IMPLANT
DRAPE SURG 17X11 SM STRL (DRAPES) ×1 IMPLANT
DRAPE U-SHAPE 47X51 STRL (DRAPES) ×2 IMPLANT
DRESSING AQUACEL AG SP 3.5X10 (GAUZE/BANDAGES/DRESSINGS) ×1 IMPLANT
DRSG AQUACEL AG ADV 3.5X10 (GAUZE/BANDAGES/DRESSINGS) IMPLANT
DRSG AQUACEL AG SP 3.5X10 (GAUZE/BANDAGES/DRESSINGS) ×1
ELECT BLADE TIP CTD 4 INCH (ELECTRODE) ×1 IMPLANT
ELECT REM PT RETURN 15FT ADLT (MISCELLANEOUS) ×1 IMPLANT
GLOVE BIOGEL PI IND STRL 8 (GLOVE) ×1 IMPLANT
GLOVE SURG ORTHO 8.0 STRL STRW (GLOVE) ×2 IMPLANT
GOWN STRL REUS W/ TWL XL LVL3 (GOWN DISPOSABLE) ×1 IMPLANT
GOWN STRL REUS W/TWL XL LVL3 (GOWN DISPOSABLE) ×3
HANDPIECE INTERPULSE COAX TIP (DISPOSABLE)
HEAD CERAMIC V40 BIOLOX DEL 28 (Orthopedic Implant) IMPLANT
HOLDER FOLEY CATH W/STRAP (MISCELLANEOUS) ×1 IMPLANT
HOOD PEEL AWAY FLYTE STAYCOOL (MISCELLANEOUS) ×3 IMPLANT
KIT BASIN OR (CUSTOM PROCEDURE TRAY) ×1 IMPLANT
KIT TURNOVER KIT A (KITS) IMPLANT
LINER 42MM E (Orthopedic Implant) IMPLANT
LINER ADM MDM INS 28/48 42E (Liner) IMPLANT
MANIFOLD NEPTUNE II (INSTRUMENTS) ×1 IMPLANT
MARKER SKIN DUAL TIP RULER LAB (MISCELLANEOUS) ×1 IMPLANT
NEEDLE HYPO 22GX1.5 SAFETY (NEEDLE) IMPLANT
NS IRRIG 1000ML POUR BTL (IV SOLUTION) ×1 IMPLANT
PACK TOTAL JOINT (CUSTOM PROCEDURE TRAY) ×1 IMPLANT
PRESSURIZER FEMORAL UNIV (MISCELLANEOUS) IMPLANT
PROTECTOR NERVE ULNAR (MISCELLANEOUS) ×1 IMPLANT
RETRIEVER SUT HEWSON (MISCELLANEOUS) ×1 IMPLANT
SCREW HEX LP 6.5X15 (Screw) IMPLANT
SCREW HEX LP 6.5X35 (Screw) IMPLANT
SEALER BIPOLAR AQUA 6.0 (INSTRUMENTS) ×1 IMPLANT
SET HNDPC FAN SPRY TIP SCT (DISPOSABLE) IMPLANT
SHELL ACETABUL CLUSTER SZ 54 (Shell) IMPLANT
SPIKE FLUID TRANSFER (MISCELLANEOUS) ×3 IMPLANT
STEM HIP 127 DEG (Stem) IMPLANT
SUCTION FRAZIER HANDLE 12FR (TUBING) ×1
SUCTION TUBE FRAZIER 12FR DISP (TUBING) ×1 IMPLANT
SUT BONE WAX W31G (SUTURE) ×1 IMPLANT
SUT ETHIBOND #5 BRAIDED 30INL (SUTURE) ×1 IMPLANT
SUT MNCRL AB 3-0 PS2 18 (SUTURE) ×1 IMPLANT
SUT STRATAFIX 0 PDS 27 VIOLET (SUTURE) ×1
SUT STRATAFIX PDO 1 14 VIOLET (SUTURE) ×1
SUT STRATFX PDO 1 14 VIOLET (SUTURE) ×1
SUT VIC AB 2-0 CT2 27 (SUTURE) ×2 IMPLANT
SUTURE STRATFX 0 PDS 27 VIOLET (SUTURE) ×1 IMPLANT
SUTURE STRATFX PDO 1 14 VIOLET (SUTURE) ×1 IMPLANT
SYR 20ML LL LF (SYRINGE) ×2 IMPLANT
TOWEL OR 17X26 10 PK STRL BLUE (TOWEL DISPOSABLE) ×1 IMPLANT
TRAY FOLEY MTR SLVR 16FR STAT (SET/KITS/TRAYS/PACK) ×1 IMPLANT
UNDERPAD 30X36 HEAVY ABSORB (UNDERPADS AND DIAPERS) ×1 IMPLANT
WATER STERILE IRR 1000ML POUR (IV SOLUTION) ×2 IMPLANT

## 2022-06-14 NOTE — Interval H&P Note (Signed)
The patient has been re-examined, and the chart reviewed, and there have been no interval changes to the documented history and physical.    Plan for L THA for end stage left hip OA  The operative side was examined and the patient was confirmed to have sensation to DPN, SPN, TN intact, Motor EHL, ext, flex 5/5, and DP 2+, PT 2+, No significant edema.   The risks, benefits, and alternatives have been discussed at length with patient, and the patient is willing to proceed.  Left hip marked. Consent has been signed.

## 2022-06-14 NOTE — Anesthesia Procedure Notes (Signed)
Procedure Name: MAC Date/Time: 06/14/2022 1:00 PM  Performed by: Eben Burow, CRNAPre-anesthesia Checklist: Patient identified, Emergency Drugs available, Suction available, Patient being monitored and Timeout performed Oxygen Delivery Method: Simple face mask Placement Confirmation: positive ETCO2

## 2022-06-14 NOTE — Op Note (Addendum)
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 male who has a diagnosis of end-stage left hip osteoarthritis and elected for surgical management after failing conservative treatment.  The risks benefits and alternatives were discussed with the patient including but not limited to the risks of nonoperative treatment, versus surgical intervention including infection, bleeding, nerve injury, periprosthetic fracture, the need for revision surgery, dislocation, leg length discrepancy, blood clots, cardiopulmonary complications, morbidity, mortality, among others, and they were willing to proceed.     OPERATIVE REPORT     SURGEON:  Charlies Constable, MD    ASSISTANT: Izola Price, RNFA, (Present throughout the entire procedure,  necessary for completion of procedure in a timely manner, assisting with retraction, instrumentation, and closure)     ANESTHESIA: Spinal  ESTIMATED BLOOD LOSS: 379KW    COMPLICATIONS:  None.     UNIQUE ASPECTS OF THE CASE: Deficient superior acetabulum with redundant labrum consistent with acetabular dysplasia good fit and purchase with acetabular shell.  Elected to use MDM liner given the patient's spinal fusion to pelvis  COMPONENTS:   Stryker Trident 2 TriTanium 54 mm acetabular shell, size E MDM liner, 6.5 hex screws x2, Accolade 2 with 127 degree neck angle size #5, 28+0 ceramic head, 28 x 48 MDM poly liner Implant Name Type Inv. Item Serial No. Manufacturer Lot No. LRB No. Used Action  SHELL ACETABUL CLUSTER SZ 54 - [id number] Shell SHELL ACETABUL CLUSTER SZ [place] [id number] A Left 1 Implanted  SCREW HEX LP 6.5X15 - [id number] Screw SCREW HEX LP 6.5X15  [employer] [id number] Left 1 Implanted  LINER  42MM E - [id number] Orthopedic Implant LINER 42MM E  [employer] [id number] Left 1 Implanted  SCREW HEX LP 6.5X35 - [id number] Screw SCREW HEX LP 6.5X35  [employer] TZK Left 1 Implanted  STEM HIP 127 DEG - [id number] Stem STEM HIP 127 DEG  [employer] [id number] A Left 1 Implanted  LINER ADM MDM INS 28/48 42E - [id number] Liner LINER ADM MDM INS 28/48 42E  [employer] [id number] Left 1 Implanted  HEAD CERAMIC V40 BIOLOX DEL 28 - [id number] Orthopedic Implant HEAD CERAMIC V40 BIOLOX DEL 28  [employer] [id number] Left 1 Implanted      PROCEDURE IN DETAIL:   The patient was met in the holding area and  identified.  The appropriate hip was identified and marked at the operative site.  The patient was then transported to the OR  and  placed under anesthesia.  At that point, the patient was  placed in the lateral decubitus position with the operative side up and  secured to the operating room table  and all bony prominences padded. A subaxillary role was also placed.    The operative lower extremity was prepped from the iliac crest to the distal leg.  Sterile draping was performed.  Preoperative antibiotics, 2 gm of ancef,1 gm of Tranexamic Acid, and 8 mg of Decadron administered. Time out was performed prior to incision.      A routine posterolateral approach was utilized via sharp dissection  carried down to the subcutaneous tissue.  Gross bleeders were Bovie coagulated.  The iliotibial band was identified and incised along the length of the skin incision through the glute max fascia.  Charnley retractor was placed  with care to protect the sciatic nerve posteriorly.  With the hip internally rotated, the piriformis tendon was identified and released from the femoral insertion and tagged with a #5 Ethibond.  A capsulotomy was then performed off the femoral insertion and also tagged with a #5 Ethibond.    The femoral neck was exposed, and I resected the femoral neck based on  preoperative templating relative to the lesser trochanter.    I then exposed the deep acetabulum, cleared out any tissue including the ligamentum teres.  After adequate visualization, I excised the labrum.  I then started reaming with a 50 mm reamer, first medializing to the floor of the cotyloid fossa, and then in the position of the cup aiming towards the greater sciatic notch, matching the version of the transverse acetabular ligament and tucked under the anterior wall. I reamed up to 54 mm reamer with good bony bed preparation and a 54 mm cup was chosen.  The real cup was then impacted into place.  Appropriate version and inclination was confirmed clinically matching their bony anatomy, and also with the use of the jig.  I placed 2 screws in the posterior superior quadrant to augment fixation.  A MDM liner was placed and impacted. It was confirmed to be appropriately seated and the acetabular retractors were removed.    I then prepared the proximal femur using the box cutter, Charnley awl, and then sequentially broached starting with 0 up to a size 5.  A trial broach, neck, and head was utilized, and I reduced the hip and it was found to have excellent stability.  There was no impingement with full extension and 90 degrees external rotation.  The hip was stable at the position of sleep and with 90 degrees flexion and 90 degrees of internal rotation.  Leg lengths were also clinically assessed in the lateral position and felt to be equal. Intra-Op flatplate was obtained and confirmed appropriate component positions.  Good fill of the femur with the size 5 broach.  And restoration of leg length and offset. No evidence or concern for fracture.  A final femoral prosthesis size 5 was selected. I then impacted the real femoral prosthesis into place.I again trialed and selected a 48+0 mm ball. The hip was then reduced and taken through a range of motion. There was no impingement with full extension and 90  degrees external rotation.  The hip was stable at the position of sleep and with 90 degrees flexion and 90 degrees of internal rotation. Leg lengths were  again assessed and felt to be restored.  We then opened the real head ball and the MDM had was assembled by myself on the back table and confirmed that the inner ball was freely spinning in the outer ball, and I impacted the real head ball into place.  The posterior capsule was then closed with #5 Ethibond.  The piriformis was repaired through the base of the abductor tendon using a [place] suture passer.  I then irrigated the hip copiously with dilute Betadine and with normal saline pulse lavage. Periarticular injection was then performed with Exparel.   We repaired the fascia #1 barbed suture, followed by 0 barbed suture for the subcutaneous fat.  Skin was closed with 2-0 Vicryl and 3-0 Monocryl.  Dermabond and Aquacel dressing were applied. The patient was then awakened and returned to PACU in stable and satisfactory condition.  Leg lengths in the supine position were assessed and felt to be clinically equal. There were  no complications.  Post op recs: WB: WBAT LLE, No formal hip precautions Abx: ancef Imaging: PACU pelvis Xray Dressing: Aquacell, keep intact until follow up DVT prophylaxis: Eliquis 2.80m BID starting POD1 Follow up: 2 weeks after surgery for a wound check [facility]  [address] [place] [address] Office Phone: [phone]  DCharlies Constable MD Orthopedic Surgeon

## 2022-06-14 NOTE — Anesthesia Procedure Notes (Signed)
Spinal  Patient location during procedure: OR Start time: 06/14/2022 1:01 PM End time: 06/14/2022 1:06 PM Reason for block: surgical anesthesia Staffing Performed: anesthesiologist  Anesthesiologist: Roderic Palau, MD Performed by: Roderic Palau, MD Authorized by: Roderic Palau, MD   Preanesthetic Checklist Completed: patient identified, IV checked, risks and benefits discussed, surgical consent, monitors and equipment checked, pre-op evaluation and timeout performed Spinal Block Patient position: sitting Prep: DuraPrep Patient monitoring: cardiac monitor, continuous pulse ox and blood pressure Approach: right paramedian (Attempted midline. Unable to locate space.) Location: L2-3 Injection technique: single-shot Needle Needle type: Qu[place]ke (Moved to [facility] when going paramedian.)  Needle gauge: 22 G Needle length: 9 cm Assessment Sensory level: T8 Events: CSF return Additional Notes Functioning IV was confirmed and monitors were applied. Sterile prep and drape, including hand hygiene and sterile gloves were used. The patient was positioned and the spine was prepped. The skin was anesthetized with lidocaine.  Free flow of clear CSF was obtained prior to injecting local anesthetic into the CSF.  The spinal needle aspirated freely following injection.  The needle was carefully withdrawn.  The patient tolerated the procedure well.

## 2022-06-14 NOTE — Transfer of Care (Signed)
Immediate Anesthesia Transfer of Care Note  Patient: Marcus Harper  Procedure(s) Performed: TOTAL HIP ARTHROPLASTY (Left: Hip)  Patient Location: PACU  Anesthesia Type:Spinal  Level of Consciousness: awake  Airway & Oxygen Therapy: Patient Spontanous Breathing and Patient connected to face mask oxygen  Post-op Assessment: Report given to RN and Post -op Vital signs reviewed and stable  Post vital signs: Reviewed and stable  Last Vitals:  Vitals Value Taken Time  BP    Temp    Pulse 59 06/14/22 1525  Resp 11 06/14/22 1525  SpO2 100 % 06/14/22 1525  Vitals shown include unvalidated device data.  Last Pain:  Vitals:   06/14/22 1123  TempSrc:   PainSc: 4       Patients Stated Pain Goal: 0 (29/47/65 4650)  Complications: No notable events documented.

## 2022-06-14 NOTE — Discharge Instructions (Signed)
Information on my medicine - ELIQUIS (apixaban)  This medication education was reviewed with me or my healthcare representative as part of my discharge preparation.  The pharmacist that spoke with me during my hospital stay was:    Why was Eliquis prescribed for you? Eliquis was prescribed for you to reduce the risk of blood clots forming after orthopedic surgery.    What do You need to know about Eliquis? Take your Eliquis TWICE DAILY - one tablet in the morning and one tablet in the evening with or without food.  It would be best to take the dose about the same time each day.  If you have difficulty swallowing the tablet whole please discuss with your pharmacist how to take the medication safely.  Take Eliquis exactly as prescribed by your doctor and DO NOT stop taking Eliquis without talking to the doctor who prescribed the medication.  Stopping without other medication to take the place of Eliquis may increase your risk of developing a clot.  After discharge, you should have regular check-up appointments with your healthcare provider that is prescribing your Eliquis.  What do you do if you miss a dose? If a dose of ELIQUIS is not taken at the scheduled time, take it as soon as possible on the same day and twice-daily administration should be resumed.  The dose should not be doubled to make up for a missed dose.  Do not take more than one tablet of ELIQUIS at the same time.  Important Safety Information A possible side effect of Eliquis is bleeding. You should call your healthcare provider right away if you experience any of the following: Bleeding from an injury or your nose that does not stop. Unusual colored urine (red or dark brown) or unusual colored stools (red or black). Unusual bruising for unknown reasons. A serious fall or if you hit your head (even if there is no bleeding).  Some medicines may interact with Eliquis and might increase your risk of bleeding or  clotting while on Eliquis. To help avoid this, consult your healthcare provider or pharmacist prior to using any new prescription or non-prescription medications, including herbals, vitamins, non-steroidal anti-inflammatory drugs (NSAIDs) and supplements.  This website has more information on Eliquis (apixaban): http://[email or web address].com/eliquis/home      Dr. Brian Swinteck Joint Replacement Specialist [facility] [address] [place], [place] [address] [phone]   TOTAL HIP REPLACEMENT POSTOPERATIVE DIRECTIONS    Hip Rehabilitation, Guidelines Following Surgery   WEIGHT BEARING Weight bearing as tolerated with assist device (walker, cane, etc) as directed, use it as long as suggested by your surgeon or therapist, typically at least 4-6 weeks.  The results of a hip operation are greatly improved after range of motion and muscle strengthening exercises. Follow all safety measures which are given to protect your hip. If any of these exercises cause increased pain or swelling in your joint, decrease the amount until you are comfortable again. Then slowly increase the exercises. Call your caregiver if you have problems or questions.   HOME CARE INSTRUCTIONS  Most of the following instructions are designed to prevent the dislocation of your new hip.  Remove items at home which could result in a fall. This includes throw rugs or furniture in walking pathways.  Continue medications as instructed at time of discharge. You may have some home medications which will be placed on hold until you complete the course of blood thinner medication. You may start showering once you are discharged home. 

## 2022-06-14 NOTE — Anesthesia Postprocedure Evaluation (Signed)
Anesthesia Post Note  Patient: Marcus Harper  Procedure(s) Performed: TOTAL HIP ARTHROPLASTY (Left: Hip)     Patient location during evaluation: PACU Anesthesia Type: Spinal Level of consciousness: awake, awake and alert and oriented Pain management: pain level controlled Vital Signs Assessment: post-procedure vital signs reviewed and stable Respiratory status: spontaneous breathing, nonlabored ventilation and respiratory function stable Cardiovascular status: blood pressure returned to baseline and stable Postop Assessment: no headache, no backache, spinal receding and no apparent nausea or vomiting Anesthetic complications: no   No notable events documented.  Last Vitals:  Vitals:   06/14/22 1652 06/14/22 1738  BP: 130/83 108/75  Pulse: 60 68  Resp: 16 16  Temp: 36.6 C 36.6 C  SpO2: 100% 99%    Last Pain:  Vitals:   06/14/22 1738  TempSrc: Oral  PainSc:                  [place]

## 2022-06-15 ENCOUNTER — Encounter (HOSPITAL_COMMUNITY): Payer: Self-pay | Admitting: Orthopedic Surgery

## 2022-06-15 DIAGNOSIS — M1612 Unilateral primary osteoarthritis, left hip: Secondary | ICD-10-CM | POA: Diagnosis not present

## 2022-06-15 LAB — CBC
HCT: 31.5 % — ABNORMAL LOW (ref 39.0–52.0)
Hemoglobin: 11.2 g/dL — ABNORMAL LOW (ref 13.0–17.0)
MCH: 33.3 pg (ref 26.0–34.0)
MCHC: 35.6 g/dL (ref 30.0–36.0)
MCV: 93.8 fL (ref 80.0–100.0)
Platelets: 238 10*3/uL (ref 150–400)
RBC: 3.36 MIL/uL — ABNORMAL LOW (ref 4.22–5.81)
RDW: 12.4 % (ref 11.5–15.5)
WBC: 14.2 10*3/uL — ABNORMAL HIGH (ref 4.0–10.5)
nRBC: 0 % (ref 0.0–0.2)

## 2022-06-15 LAB — BASIC METABOLIC PANEL
Anion gap: 8 (ref 5–15)
BUN: 23 mg/dL (ref 8–23)
CO2: 25 mmol/L (ref 22–32)
Calcium: 9 mg/dL (ref 8.9–10.3)
Chloride: 99 mmol/L (ref 98–111)
Creatinine, Ser: 0.82 mg/dL (ref 0.61–1.24)
GFR, Estimated: >60 mL/min (ref >60)
Glucose, Bld: 160 mg/dL — ABNORMAL HIGH (ref 70–99)
Potassium: 4 mmol/L (ref 3.5–5.1)
Sodium: 132 mmol/L — ABNORMAL LOW (ref 135–145)

## 2022-06-15 MED ORDER — OXYCODONE-ACETAMINOPHEN 10-325 MG PO TABS: 1.0000 | ORAL_TABLET | ORAL | 0 refills | Status: AC | PRN

## 2022-06-15 MED ORDER — ONDANSETRON HCL 4 MG PO TABS: 4.0000 mg | ORAL_TABLET | Freq: Three times a day (TID) | ORAL | 0 refills | Status: AC | PRN

## 2022-06-15 MED ORDER — METHOCARBAMOL 500 MG PO TABS: 500.0000 mg | ORAL_TABLET | Freq: Three times a day (TID) | ORAL | 0 refills | Status: AC | PRN

## 2022-06-15 MED ORDER — APIXABAN 2.5 MG PO TABS: 2.5000 mg | ORAL_TABLET | Freq: Two times a day (BID) | ORAL | 0 refills | Status: AC

## 2022-06-15 NOTE — Progress Notes (Signed)
Physical Therapy Treatment Patient Details Name: Marcus Harper MRN: [id number] DOB: 1951-08-01 Today's Date: 06/15/2022   History of Present Illness 71 yo male  admitted 06/14/22 for L THA, posterior. PMH: PPM, back fusion, CAD, HTN, endocarditis.    PT Comments    Patient has progressed well and has met PT goals for Dc. Wife present for instruction on stairsmwith 1 crutch and rail.   Recommendations for follow up therapy are one component of a multi-disciplinary discharge planning process, led by the attending physician.  Recommendations may be updated based on patient status, additional functional criteria and insurance authorization.  Follow Up Recommendations  Follow physician's recommendations for discharge plan and follow up therapies     Assistance Recommended at Discharge Intermittent Supervision/Assistance  Patient can return home with the following Help with stairs or ramp for entrance;A little help with bathing/dressing/bathroom;Assistance with cooking/housework;Assist for transportation   Equipment Recommendations  Rolling walker (2 wheels);Crutches    Recommendations for Other Services       Precautions / Restrictions Precautions Precautions: Fall Restrictions LLE Weight Bearing: Weight bearing as tolerated     Mobility  Bed Mobility Overal bed mobility: Needs Assistance Bed Mobility: Supine to Sit     Supine to sit: HOB elevated, Supervision     General bed mobility comments: in recliner    Transfers Overall transfer level: Needs assistance Equipment used: Rolling walker (2 wheels) Transfers: Sit to/from Stand Sit to Stand: Supervision                Ambulation/Gait Ambulation/Gait assistance: Supervision Gait Distance (Feet): 600 Feet Assistive device: Rolling walker (2 wheels) Gait Pattern/deviations: Step-through pattern Gait velocity: decr     General Gait Details: cues for safety, starts to walk away from the RW   Stairs Stairs:  Yes Stairs assistance: Supervision Stair Management: One rail Left, Step to pattern, With crutches   General stair comments: practiced   4  with crutche and a rail, frequent cues for safety and sequence.   Wheelchair Mobility    Modified Rankin (Stroke Patients Only)       Balance Overall balance assessment: No apparent balance deficits (not formally assessed)                                          Cognition Arousal/Alertness: Awake/alert Behavior During Therapy: WFL for tasks assessed/performed Overall Cognitive Status: Within Functional Limits for tasks assessed                                          Exercises      General Comments        Pertinent Vitals/Pain Pain Assessment Pain Assessment: 0-10 Pain Score: 2  Pain Location: left hip Pain Descriptors / Indicators: Discomfort, Tightness Pain Intervention(s): Monitored during session    Home Living Family/patient expects to be discharged to:: Private residence Living Arrangements: Spouse/significant other Available Help at Discharge: Family;Available 24 hours/day Type of Home: House Home Access: Level entry     Alternate Level Stairs-Number of Steps: 16 Home Layout: Two level;Bed/bath upstairs Home Equipment: Grab bars - tub/shower      Prior Function            PT Goals (current goals can now be found in the care plan section) Acute Rehab PT  Goals Patient Stated Goal: to go home, walk PT Goal Formulation: With patient Time For Goal Achievement: 06/22/22 Potential to Achieve Goals: Good Progress towards PT goals: Progressing toward goals    Frequency    7X/week      PT Plan Current plan remains appropriate    Co-evaluation              AM-PAC PT "6 Clicks" Mobility   Outcome Measure  Help needed turning from your back to your side while in a flat bed without using bedrails?: None Help needed moving from lying on your back to sitting on the  side of a flat bed without using bedrails?: None Help needed moving to and from a bed to a chair (including a wheelchair)?: A Little Help needed standing up from a chair using your arms (e.g., wheelchair or bedside chair)?: A Little Help needed to walk in hospital room?: A Little Help needed climbing 3-5 steps with a railing? : A Little 6 Click Score: 20    End of Session Equipment Utilized During Treatment: Gait belt Activity Tolerance: Patient tolerated treatment well Patient left: in chair;with call bell/phone within reach;with family/visitor present Nurse Communication: Mobility status PT Visit Diagnosis: Unsteadiness on feet (R26.81);Pain Pain - Right/Left: Left Pain - part of body: Hip     Time: 1045-1110 PT Time Calculation (min) (ACUTE ONLY): 25 min  Charges:  $Gait Training: 23-37 mins                     [place] Office [phone] Weekend [phone]    Claretha Cooper 06/15/2022, 12:51 PM

## 2022-06-15 NOTE — TOC Initial Note (Signed)
Transition of Care [facility]) - Initial/Assessment Note    Patient Details  Name: Marcus Harper MRN: [id number] Date of Birth: [birth year]  Transition [facility] CM/SW Contact:    Servando Snare, LCSW Phone Number: 06/15/2022, 11:24 AM  Clinical Narrative:  TOC received order for RW at dc. Walker ordered and will be delivered to patient at bedside prior to leaving hospital.      Barriers to Discharge: No Barriers Identified   Patient Goals and CMS Choice     Choice offered to / list presented to : NA  Expected Discharge Plan and Services           Expected Discharge Date: 06/15/22               DME Arranged: Gilford Rile rolling DME Agency: [employer] Date DME Agency Contacted: 06/15/22 Time DME Agency Contacted: [id number] Representative spoke with at DME Agency: [place]: NA [place] Agency: NA        Prior Living Arrangements/Services                       Activities of Daily Living Home Assistive Devices/Equipment: Eyeglasses ADL Screening (condition at time of admission) Patient's cognitive ability adequate to safely complete daily activities?: Yes Is the patient deaf or have difficulty hearing?: No Does the patient have difficulty seeing, even when wearing glasses/contacts?: No Does the patient have difficulty concentrating, remembering, or making decisions?: No Patient able to express need for assistance with ADLs?: Yes Does the patient have difficulty dressing or bathing?: No Independently performs ADLs?: Yes (appropriate for developmental age) Does the patient have difficulty walking or climbing stairs?: Yes Weakness of Legs: Both Weakness of Arms/Hands: None  Permission Sought/Granted                  Emotional Assessment              Admission diagnosis:  Osteoarthritis of left hip [M16.12] Patient Active Problem List   Diagnosis Date Noted   Osteoarthritis of left hip 06/14/2022   S/P lumbar fusion 10/16/2021   Pacemaker  11/03/2017   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   Nonrheumatic aortic valve stenosis 09/23/2017   Dizziness 02/10/2016   Statin myopathy 11/07/2014   Chronic arthralgias of knees and hips 11/04/2014   Overweight 11/04/2014   Malignant neoplasm of prostate ([place]) 10/17/2013   Hyperlipidemia 08/20/2013   Essential hypertension 02/12/2013   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   Elevated prostate specific antigen (PSA) 08/18/2011   Urinary incontinence, male, stress 08/18/2011   Fatigue 06/07/2011   Ocular migraine 06/07/2011   Endocarditis 11/24/2010   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

## 2022-06-15 NOTE — Evaluation (Signed)
Physical Therapy Evaluation Patient Details Name: Marcus Harper MRN: [id number] DOB: October 28, 1950 Today's Date: 06/15/2022  History of Present Illness  71 yo male  admitted 06/14/22 for L THA, posterior. PMH: PPM, back fusion, CAD, HTN, endocarditis.  Clinical Impression  The patient is progressing well,  min pain/4/10. Pleased with how the left hip feels, was quite debilitated prior  to surgery per patient. Will practice stairs and order Rw and  crutches. Pt admitted with above diagnosis.  Pt currently with functional limitations due to the deficits listed below (see PT Problem List). Pt will benefit from skilled PT to increase their independence and safety with mobility to allow discharge to the venue listed below.          Recommendations for follow up therapy are one component of a multi-disciplinary discharge planning process, led by the attending physician.  Recommendations may be updated based on patient status, additional functional criteria and insurance authorization.  Follow Up Recommendations Follow physician's recommendations for discharge plan and follow up therapies      Assistance Recommended at Discharge Intermittent Supervision/Assistance  Patient can return home with the following  Help with stairs or ramp for entrance;A little help with bathing/dressing/bathroom;Assistance with cooking/housework;Assist for transportation    Equipment Recommendations Rolling walker (2 wheels);Crutches  Recommendations for Other Services       Functional Status Assessment Patient has had a recent decline in their functional status and demonstrates the ability to make significant improvements in function in a reasonable and predictable amount of time.     Precautions / Restrictions Precautions Precautions: Fall Restrictions Weight Bearing Restrictions: Yes LLE Weight Bearing: Weight bearing as tolerated      Mobility  Bed Mobility Overal bed mobility: Needs Assistance Bed  Mobility: Supine to Sit     Supine to sit: HOB elevated, Supervision          Transfers Overall transfer level: Needs assistance Equipment used: Rolling walker (2 wheels) Transfers: Sit to/from Stand Sit to Stand: Supervision                Ambulation/Gait Ambulation/Gait assistance: Supervision Gait Distance (Feet): 120 Feet Assistive device: Rolling walker (2 wheels) Gait Pattern/deviations: Step-through pattern       General Gait Details: cues for position inside RW  Stairs            Wheelchair Mobility    Modified Rankin (Stroke Patients Only)       Balance Overall balance assessment: Mild deficits observed, not formally tested                                           Pertinent Vitals/Pain Pain Assessment Pain Assessment: 0-10 Pain Score: 3  Pain Location: left hip Pain Descriptors / Indicators: Discomfort Pain Intervention(s): Monitored during session, Premedicated before session, Ice applied    Home Living Family/patient expects to be discharged to:: Private residence Living Arrangements: Spouse/significant other Available Help at Discharge: Family;Available 24 hours/day Type of Home: House Home Access: Level entry     Alternate Level Stairs-Number of Steps: 16 Home Layout: Two level;Bed/bath upstairs Home Equipment: Grab bars - tub/shower      Prior Function Prior Level of Function : Independent/Modified Independent;Working/employed;Driving;Other (comment)             Mobility Comments: limited ambulation PTA       Hand Dominance   Dominant Hand: Right  Extremity/Trunk Assessment   Upper Extremity Assessment Upper Extremity Assessment: Overall WFL for tasks assessed    Lower Extremity Assessment Lower Extremity Assessment: LLE deficits/detail LLE Deficits / Details: able to move leg to bed edge, WBAT    Cervical / Trunk Assessment Cervical / Trunk Assessment: Normal  Communication    Communication: No difficulties  Cognition Arousal/Alertness: Awake/alert Behavior During Therapy: WFL for tasks assessed/performed Overall Cognitive Status: Within Functional Limits for tasks assessed                                          General Comments      Exercises     Assessment/Plan    PT Assessment Patient needs continued PT services  PT Problem List Decreased strength;Decreased mobility;Decreased safety awareness;Decreased activity tolerance;Decreased knowledge of use of DME       PT Treatment Interventions DME instruction;Therapeutic activities;Gait training;Therapeutic exercise;Patient/family education;Functional mobility training;Stair training    PT Goals (Current goals can be found in the Care Plan section)  Acute Rehab PT Goals Patient Stated Goal: to go home, walk PT Goal Formulation: With patient Time For Goal Achievement: 06/22/22 Potential to Achieve Goals: Good    Frequency 7X/week     Co-evaluation               AM-PAC PT "6 Clicks" Mobility  Outcome Measure Help needed turning from your back to your side while in a flat bed without using bedrails?: None Help needed moving from lying on your back to sitting on the side of a flat bed without using bedrails?: None Help needed moving to and from a bed to a chair (including a wheelchair)?: A Little Help needed standing up from a chair using your arms (e.g., wheelchair or bedside chair)?: A Little Help needed to walk in hospital room?: A Little Help needed climbing 3-5 steps with a railing? : A Little 6 Click Score: 20    End of Session Equipment Utilized During Treatment: Gait belt Activity Tolerance: Patient tolerated treatment well Patient left: in chair;with call bell/phone within reach Nurse Communication: Mobility status PT Visit Diagnosis: Unsteadiness on feet (R26.81);Pain Pain - Right/Left: Left Pain - part of body: Hip    Time: 0834-0900 PT Time Calculation  (min) (ACUTE ONLY): 26 min   Charges:   PT Evaluation $PT Eval Low Complexity: 1 Low PT Treatments $Gait Training: 8-22 mins        [place] Office [phone] Weekend [phone]   Claretha Cooper 06/15/2022, 10:20 AM

## 2022-06-15 NOTE — TOC Progression Note (Signed)
Transition of Care [facility]) - Progression Note    Patient Details  Name: Marcus Harper MRN: [id number] Date of Birth: March 29, 1951  Transition of [facility]) CM/SW Contact  Servando Snare, [place] Phone Number: 06/15/2022, 9:36 AM  Clinical Narrative:      Transition of Care [facility]) Screening Note   Patient Details  Name: Marcus Harper Date of Birth: May 22, 1951   Transition of Care [facility]) CM/SW Contact:    Servando Snare, LCSW Phone Number: 06/15/2022, 9:36 AM    Transition of Care Department [facility]) has reviewed patient and no TOC needs have been identified at this time. We will continue to monitor patient advancement through interdisciplinary progression rounds. If new patient transition needs arise, please place a TOC consult.         Expected Discharge Plan and Services           Expected Discharge Date: 06/15/22                                     Social Determinants of Health (SDOH) Interventions    Readmission Risk Interventions     No data to display

## 2022-06-15 NOTE — Plan of Care (Signed)
Patient is stable for discharge. Discharge instructions given and all questions answered. Patient is discharged home with wife.

## 2022-06-15 NOTE — Progress Notes (Signed)
Orthopedic Tech Progress Note Patient Details:  Marcus Harper 10-Mar-1951 [id number]  Patient ID: Marcus Harper, male   DOB: 1950/11/30, 71 y.o.   MRN: [id number]  Marcus Harper 06/15/2022, 10:38 AM Crutches sized and deliverred to room per verbal order from PT

## 2022-06-15 NOTE — Progress Notes (Addendum)
     Subjective:  Patient reports pain as mild.  Slept well overnight. No concerns. Denies distal n/t. Hopeful to discharge home today.  Objective:   VITALS:   Vitals:   06/14/22 1944 06/14/22 2108 06/15/22 0205 06/15/22 0448  BP: 108/69 122/71 131/78 118/64  Pulse: 81 68 61 63  Resp: '18 18 18 18  '$ Temp: 98.3 F (36.8 C) 98.3 F (36.8 C) 97.9 F (36.6 C) 98.2 F (36.8 C)  TempSrc: Oral Oral Oral Oral  SpO2: 96% 96% 95% 96%  Weight:      Height:        Sensation intact distally Intact pulses distally Dorsiflexion/Plantar flexion intact Incision: dressing C/D/I Compartment soft   Lab Results  Component Value Date   WBC 14.2 (H) 06/15/2022   HGB 11.2 (L) 06/15/2022   HCT 31.5 (L) 06/15/2022   MCV 93.8 06/15/2022   PLT 238 06/15/2022   BMET    Component Value Date/Time   NA 132 (L) 06/15/2022 0428   K 4.0 06/15/2022 0428   CL 99 06/15/2022 0428   CO2 25 06/15/2022 0428   GLUCOSE 160 (H) 06/15/2022 0428   BUN 23 06/15/2022 0428   CREATININE 0.82 06/15/2022 0428   CALCIUM 9.0 06/15/2022 0428   GFRNONAA >60 06/15/2022 0428      Xray: post op xrays demonstrate THA components in good position no adverse features. Calcific density lateral of superior acetabulum is unchanged from preop.  Assessment/Plan: 1 Day Post-Op   Principal Problem:   Osteoarthritis of left hip  S/p THA 06/14/22  Post op recs: WB: WBAT LLE, No formal hip precautions Abx: ancef Imaging: PACU pelvis Xray Dressing: Aquacell, keep intact until follow up DVT prophylaxis: Eliquis 2.'5mg'$  BID starting POD1 Follow up: 2 weeks after surgery for a wound check with Dr. Zachery Dakins at [facility].  Address: [address] [place], [place], [place] [address]  Office Phone: [phone]    Willaim Sheng 06/15/2022, 6:15 AM   Charlies Constable, MD  Contact information:   [phone] 7am-5pm epic message Dr. Zachery Dakins, or call office for patient follow up: [phone]  [phone] After hours and holidays please check [email or web address] for group call information for [facility]

## 2022-06-15 NOTE — Discharge Summary (Signed)
Physician Discharge Summary  Patient ID: Marcus Harper MRN: [id number] DOB/AGE: 1950/11/10 71 y.o.  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 male who was admitted 06/14/2022 with a diagnosis of Osteoarthritis of left hip and went to the operating room on 06/14/2022 and underwent the above named procedures.    He was given perioperative antibiotics:  Anti-infectives (From admission, onward)    Start     Dose/Rate Route Frequency Ordered Stop   06/14/22 1900  ceFAZolin (ANCEF) IVPB 2g/100 mL premix        2 g 200 mL/hr over 30 Minutes Intravenous Every 6 hours 06/14/22 1643 06/15/22 0100   06/14/22 1100  ceFAZolin (ANCEF) IVPB 2g/100 mL premix        2 g 200 mL/hr over 30 Minutes Intravenous On call to O.R. 06/14/22 1045 06/14/22 1318     .  He was given sequential compression devices, early ambulation, and eliquis for DVT prophylaxis.  He benefited maximally from the hospital stay and there were no complications.    Recent vital signs:  Vitals:   06/15/22 0826 06/15/22 1201  BP: (!) 150/75 (!) 137/97  Pulse: 71 70  Resp: 18 18  Temp: 98.6 F (37 C) 98.3 F (36.8 C)  SpO2: 99% 97%    Recent laboratory studies:   Lab Results  Component Value Date   HGB 11.2 (L) 06/15/2022   HGB 13.0 06/01/2022   HGB 14.3 10/14/2021   Lab Results  Component Value Date   WBC 14.2 (H) 06/15/2022   PLT 238 06/15/2022   Lab Results  Component Value Date   INR 1.0 10/14/2021   Lab Results  Component Value Date   NA 132 (L) 06/15/2022   K 4.0 06/15/2022   CL 99 06/15/2022   CO2 25 06/15/2022   BUN 23 06/15/2022   CREATININE 0.82 06/15/2022   GLUCOSE 160 (H) 06/15/2022    Discharge Medications:   Allergies as of 06/15/2022       Reactions   Penicillins Other (See Comments)   Childhood not sure of reaction Has tolerated cefazolin per [facility]        Medication List     STOP taking these medications    aspirin EC 81 MG tablet       TAKE these medications    acetaminophen 500 MG tablet Commonly known as: TYLENOL Take 1,000 mg by mouth 2 (two) times daily as needed for moderate pain.   amLODipine 5 MG tablet Commonly known as: NORVASC Take 5 mg by mouth in the morning.   apixaban 2.5 MG Tabs tablet Commonly known as: Eliquis Take 1 tablet (2.5 mg total) by  mouth 2 (two) times daily.   atorvastatin 80 MG tablet Commonly known as: LIPITOR Take 80 mg by mouth in the morning.   ECHINACEA PO Take 2 tablets by mouth daily.   escitalopram 10 MG tablet Commonly known as: LEXAPRO Take 10 mg by mouth daily. Take with 5 mg to equal 15 mg daily   escitalopram 5 MG tablet Commonly known as: LEXAPRO Take 5 mg by mouth in the morning. Take with 10 mg to equal 15 mg daily   hydrochlorothiazide 25 MG tablet Commonly known as: HYDRODIURIL Take 25 mg by mouth in the morning.   latanoprost 0.005 % ophthalmic solution Commonly known as: XALATAN Place 1 drop into both eyes at bedtime.   lisinopril 40 MG tablet Commonly known as: ZESTRIL Take 40 mg by mouth in the morning.   meloxicam 15 MG tablet Commonly known as: MOBIC Take 15 mg by mouth in the morning.   methocarbamol 500 MG  tablet Commonly known as: ROBAXIN Take 1 tablet (500 mg total) by mouth every 8 (eight) hours as needed for up to 10 days for muscle spasms.   metoprolol succinate 25 MG 24 hr tablet Commonly known as: TOPROL-XL Take 25 mg by mouth at bedtime.   omeprazole 20 MG capsule Commonly known as: PRILOSEC Take 20 mg by mouth in the morning.   ondansetron 4 MG tablet Commonly known as: Zofran Take 1 tablet (4 mg total) by mouth every 8 (eight) hours as needed for up to 14 days for nausea or vomiting.   oxybutynin 10 MG 24 hr tablet Commonly known as: DITROPAN-XL Take 10 mg by mouth daily.   oxyCODONE-acetaminophen 10-325 MG tablet Commonly known as: PERCOCET Take 1 tablet by mouth every 4 (four) hours as needed for up to 7 days for pain. What changed:  when to take this reasons to take this   polyethylene glycol 17 g packet Commonly known as: MIRALAX / GLYCOLAX Take 17 g by mouth daily.   traZODone 100 MG tablet Commonly known as: DESYREL Take 100 mg by mouth at bedtime.   VITAMIN C PO Take 1 tablet by mouth daily.   VITAMIN D3 PO Take 1 capsule by mouth daily.               Durable Medical Equipment  (From admission, onward)           Start     Ordered   06/15/22 0836  For home use only DME Walker rolling  Once       Question Answer Comment  Walker: With 5 Inch Wheels   Patient needs a walker to treat with the following condition Difficulty in walking, not elsewhere classified      06/15/22 0836            Diagnostic Studies: DG HIP UNILAT W OR W/O PELVIS 2-3 VIEWS LEFT  Result Date: 06/14/2022 CLINICAL DATA:  Status post left hip arthroplasty EXAM: DG HIP (WITH OR WITHOUT PELVIS) 2-3V LEFT COMPARISON:  03/17/2021 FINDINGS: There is interval left hip arthroplasty. No fracture is seen in proximal femur. There is 1.6 cm calcific density lateral to the left acetabulum, possibly arising from the lateral aspect of left acetabulum related to recent surgery.  There is surgical fusion in lower lumbar spine. IMPRESSION: Status post left hip arthroplasty. Electronically Signed   By: Elmer Picker M.D.   On: 06/14/2022 16:28   DG Pelvis Portable  Result Date: 06/14/2022 CLINICAL DATA:  Elective surgery, intraop. EXAM: PORTABLE PELVIS  1-2 VIEWS COMPARISON:  None Available. FINDINGS: Single frontal view of the pelvis obtained during left hip arthroplasty. Acetabular cup and portions of the femoral component are in place. IMPRESSION: Intraoperative view during left hip arthroplasty. Electronically Signed   By: Keith Rake M.D.   On: 06/14/2022 14:56    Disposition: Discharge disposition: 01-Home or Self Care       Discharge Instructions     Call MD / Call 911   Complete by: As directed    If you experience chest pain or shortness of breath, CALL 911 and be transported to the hospital emergency room.  If you develope a fever above 101 F, pus (white drainage) or increased drainage or redness at the wound, or calf pain, call your surgeon's office.   Constipation Prevention   Complete by: As directed    Drink plenty of fluids.  Prune juice may be helpful.  You may use a stool softener, such as Colace (over the counter) 100 mg twice a day.  Use MiraLax (over the counter) for constipation as needed.   Diet - low sodium heart healthy   Complete by: As directed    Increase activity slowly as tolerated   Complete by: As directed    Post-operative opioid taper instructions:   Complete by: As directed    POST-OPERATIVE OPIOID TAPER INSTRUCTIONS: It is important to wean off of your opioid medication as soon as possible. If you do not need pain medication after your surgery it is ok to stop day one. Opioids include: Codeine, Hydrocodone(Norco, Vicodin), Oxycodone(Percocet, oxycontin) and hydromorphone amongst others.  Long term and even short term use of opiods can cause: Increased pain response Dependence Constipation Depression Respiratory  depression And more.  Withdrawal symptoms can include Flu like symptoms Nausea, vomiting And more Techniques to manage these symptoms Hydrate well Eat regular healthy meals Stay active Use relaxation techniques(deep breathing, meditating, yoga) Do Not substitute Alcohol to help with tapering If you have been on opioids for less than two weeks and do not have pain than it is ok to stop all together.  Plan to wean off of opioids This plan should start within one week post op of your joint replacement. Maintain the same interval or time between taking each dose and first decrease the dose.  Cut the total daily intake of opioids by one tablet each day Next start to increase the time between doses. The last dose that should be eliminated is the evening dose.               Discharge Instructions      INSTRUCTIONS AFTER JOINT REPLACEMENT   Remove items at home which could result in a fall. This includes throw rugs or furniture in walking pathways ICE to the affected joint every three hours while awake for 30 minutes at a time, for at least the first 3-5 days, and then as needed for pain and swelling.  Continue to use ice for pain and swelling. You may notice swelling that will progress down to the foot and ankle.  This is normal after surgery.  Elevate your leg when you are not up walking on it.   Continue to use the breathing machine you got in the hospital (incentive spirometer) which will help keep your temperature down.  It is common for your temperature to cycle up and down following surgery, especially at night when you are not up moving around and exerting yourself.  The breathing machine keeps your lungs expanded  and your temperature down.   DIET:  As you were doing prior to hospitalization, we recommend a well-balanced diet.  DRESSING / WOUND CARE / SHOWERING  Keep the surgical dressing until follow up.  The dressing is water proof, so you can shower without any extra  covering.  IF THE DRESSING FALLS OFF or the wound gets wet inside, change the dressing with sterile gauze.  Please use good hand washing techniques before changing the dressing.  Do not use any lotions or creams on the incision until instructed by your surgeon.    ACTIVITY  Increase activity slowly as tolerated, but follow the weight bearing instructions below.   No driving for 6 weeks or until further direction given by your physician.  You cannot drive while taking narcotics.  No lifting or carrying greater than 10 lbs. until further directed by your surgeon. Avoid periods of inactivity such as sitting longer than an hour when not asleep. This helps prevent blood clots.  You may return to work once you are authorized by your doctor.     WEIGHT BEARING   Weight bearing as tolerated with assist device (walker, cane, etc) as directed, use it as long as suggested by your surgeon or therapist, typically at least 4-6 weeks.   EXERCISES  Results after joint replacement surgery are often greatly improved when you follow the exercise, range of motion and muscle strengthening exercises prescribed by your doctor. Safety measures are also important to protect the joint from further injury. Any time any of these exercises cause you to have increased pain or swelling, decrease what you are doing until you are comfortable again and then slowly increase them. If you have problems or questions, call your caregiver or physical therapist for advice.   Rehabilitation is important following a joint replacement. After just a few days of immobilization, the muscles of the leg can become weakened and shrink (atrophy).  These exercises are designed to build up the tone and strength of the thigh and leg muscles and to improve motion. Often times heat used for twenty to thirty minutes before working out will loosen up your tissues and help with improving the range of motion but do not use heat for the first two weeks  following surgery (sometimes heat can increase post-operative swelling).   These exercises can be done on a training (exercise) mat, on the floor, on a table or on a bed. Use whatever works the best and is most comfortable for you.    Use music or television while you are exercising so that the exercises are a pleasant break in your day. This will make your life better with the exercises acting as a break in your routine that you can look forward to.   Perform all exercises about fifteen times, three times per day or as directed.  You should exercise both the operative leg and the other leg as well.  Exercises include:   Quad Sets - Tighten up the muscle on the front of the thigh (Quad) and hold for 5-10 seconds.   Straight Leg Raises - With your knee straight (if you were given a brace, keep it on), lift the leg to 60 degrees, hold for 3 seconds, and slowly lower the leg.  Perform this exercise against resistance later as your leg gets stronger.  Leg Slides: Lying on your back, slowly slide your foot toward your buttocks, bending your knee up off the floor (only go as far as is comfortable). Then slowly  slide your foot back down until your leg is flat on the floor again.  Angel Wings: Lying on your back spread your legs to the side as far apart as you can without causing discomfort.  Hamstring Strength:  Lying on your back, push your heel against the floor with your leg straight by tightening up the muscles of your buttocks.  Repeat, but this time bend your knee to a comfortable angle, and push your heel against the floor.  You may put a pillow under the heel to make it more comfortable if necessary.   A rehabilitation program following joint replacement surgery can speed recovery and prevent re-injury in the future due to weakened muscles. Contact your doctor or a physical therapist for more information on knee rehabilitation.    CONSTIPATION  Constipation is defined medically as fewer than three  stools per week and severe constipation as less than one stool per week.  Even if you have a regular bowel pattern at home, your normal regimen is likely to be disrupted due to multiple reasons following surgery.  Combination of anesthesia, postoperative narcotics, change in appetite and fluid intake all can affect your bowels.   YOU MUST use at least one of the following options; they are listed in order of increasing strength to get the job done.  They are all available over the counter, and you may need to use some, POSSIBLY even all of these options:    Drink plenty of fluids (prune juice may be helpful) and high fiber foods Colace 100 mg by mouth twice a day  Senokot for constipation as directed and as needed Dulcolax (bisacodyl), take with full glass of water  Miralax (polyethylene glycol) once or twice a day as needed.  If you have tried all these things and are unable to have a bowel movement in the first 3-4 days after surgery call either your surgeon or your primary doctor.    If you experience loose stools or diarrhea, hold the medications until you stool forms back up.  If your symptoms do not get better within 1 week or if they get worse, check with your doctor.  If you experience "the worst abdominal pain ever" or develop nausea or vomiting, please contact the office immediately for further recommendations for treatment.   ITCHING:  If you experience itching with your medications, try taking only a single pain pill, or even half a pain pill at a time.  You can also use Benadryl over the counter for itching or also to help with sleep.   TED HOSE STOCKINGS:  Use stockings on both legs until for at least 2 weeks or as directed by physician office. They may be removed at night for sleeping.  MEDICATIONS:  See your medication summary on the "After Visit Summary" that nursing will review with you.  You may have some home medications which will be placed on hold until you complete the course  of blood thinner medication.  It is important for you to complete the blood thinner medication as prescribed.   Blood clot prevention (DVT Prophylaxis): After surgery you are at an increased risk for a blood clot. you were prescribed a blood thinner, eliquis 2.'5mg'$ , to be taken twice daily for a total of 4 weeks from surgery to help reduce your risk of getting a blood clot. This will help prevent a blood clot. Signs of a pulmonary embolus (blood clot in the lungs) include sudden short of breath, feeling lightheaded or dizzy, chest  pain with a deep breath, rapid pulse rapid breathing. Signs of a blood clot in your arms or legs include new unexplained swelling and cramping, warm, red or darkened skin around the painful area. Please call the office or 911 right away if these signs or symptoms develop.  PRECAUTIONS:  If you experience chest pain or shortness of breath - call 911 immediately for transfer to the hospital emergency department.   If you develop a fever greater that 101 F, purulent drainage from wound, increased redness or drainage from wound, foul odor from the wound/dressing, or calf pain - CONTACT YOUR SURGEON.                                                   FOLLOW-UP APPOINTMENTS:  If you do not already have a post-op appointment, please call the office for an appointment to be seen by your surgeon.  Guidelines for how soon to be seen are listed in your "After Visit Summary", but are typically between 2-3 weeks after surgery.  OTHER INSTRUCTIONS:    POST-OPERATIVE OPIOID TAPER INSTRUCTIONS: It is important to wean off of your opioid medication as soon as possible. If you do not need pain medication after your surgery it is ok to stop day one. Opioids include: Codeine, Hydrocodone(Norco, Vicodin), Oxycodone(Percocet, oxycontin) and hydromorphone amongst others.  Long term and even short term use of opiods can cause: Increased pain  response Dependence Constipation Depression Respiratory depression And more.  Withdrawal symptoms can include Flu like symptoms Nausea, vomiting And more Techniques to manage these symptoms Hydrate well Eat regular healthy meals Stay active Use relaxation techniques(deep breathing, meditating, yoga) Do Not substitute Alcohol to help with tapering If you have been on opioids for less than two weeks and do not have pain than it is ok to stop all together.  Plan to wean off of opioids This plan should start within one week post op of your joint replacement. Maintain the same interval or time between taking each dose and first decrease the dose.  Cut the total daily intake of opioids by one tablet each day Next start to increase the time between doses. The last dose that should be eliminated is the evening dose.   MAKE SURE YOU:  Understand these instructions.  Get help right away if you are not doing well or get worse.    Thank you for letting us be a part of your medical care team.  It is a privilege we respect greatly.  We hope these instructions will help you stay on track for a fast and full recovery!          Information on my medicine - ELIQUIS (apixaban)  This medication education was reviewed with me or my healthcare representative as part of my discharge preparation.  The pharmacist that spoke with me during my hospital stay was:    Why was Eliquis prescribed for you? Eliquis was prescribed for you to reduce the risk of blood clots forming after orthopedic surgery.    What do You need to know about Eliquis? Take your Eliquis TWICE DAILY - one tablet in the morning and one tablet in the evening with or without food.  It would be best to take the dose about the same time each day.  If you have difficulty swallowing the tablet whole please discuss  with your pharmacist how to take the medication safely.  Take Eliquis exactly as prescribed by your doctor and DO  NOT stop taking Eliquis without talking to the doctor who prescribed the medication.  Stopping without other medication to take the place of Eliquis may increase your risk of developing a clot.  After discharge, you should have regular check-up appointments with your healthcare provider that is prescribing your Eliquis.  What do you do if you miss a dose? If a dose of ELIQUIS is not taken at the scheduled time, take it as soon as possible on the same day and twice-daily administration should be resumed.  The dose should not be doubled to make up for a missed dose.  Do not take more than one tablet of ELIQUIS at the same time.  Important Safety Information A possible side effect of Eliquis is bleeding. You should call your healthcare provider right away if you experience any of the following: Bleeding from an injury or your nose that does not stop. Unusual colored urine (red or dark brown) or unusual colored stools (red or black). Unusual bruising for unknown reasons. A serious fall or if you hit your head (even if there is no bleeding).  Some medicines may interact with Eliquis and might increase your risk of bleeding or clotting while on Eliquis. To help avoid this, consult your healthcare provider or pharmacist prior to using any new prescription or non-prescription medications, including herbals, vitamins, non-steroidal anti-inflammatory drugs (NSAIDs) and supplements.  This website has more information on Eliquis (apixaban): http://www.eliquis.com/eliquis/home     Signed: Aireonna Bauer A [place] 06/15/2022, 4:05 PM

## 2022-07-01 ENCOUNTER — Telehealth (HOSPITAL_COMMUNITY): Payer: Self-pay | Admitting: Orthopedic Surgery

## 2022-07-01 ENCOUNTER — Other Ambulatory Visit: Payer: Self-pay | Admitting: Orthopedic Surgery

## 2022-07-01 MED ORDER — OXYCODONE-ACETAMINOPHEN 10-325 MG PO TABS: 1.0000 | ORAL_TABLET | Freq: Four times a day (QID) | ORAL | 0 refills | Status: AC | PRN

## 2022-07-01 NOTE — Telephone Encounter (Signed)
Refill for oxy sent to patient's pharmacy. S/p THA on 06/14/22

## 2022-09-22 ENCOUNTER — Other Ambulatory Visit (HOSPITAL_COMMUNITY): Payer: Self-pay | Admitting: Orthopedic Surgery

## 2022-09-22 DIAGNOSIS — M8700 Idiopathic aseptic necrosis of unspecified bone: Secondary | ICD-10-CM

## 2022-09-22 DIAGNOSIS — M545 Low back pain, unspecified: Secondary | ICD-10-CM

## 2022-10-29 ENCOUNTER — Ambulatory Visit (HOSPITAL_COMMUNITY)
Admission: RE | Admit: 2022-10-29 | Discharge: 2022-10-29 | Disposition: A | Discharge status: Home / Self Care | Payer: Medicare HMO | Source: Ambulatory Visit | Attending: Orthopedic Surgery | Admitting: Orthopedic Surgery

## 2022-10-29 DIAGNOSIS — M545 Low back pain, unspecified: Secondary | ICD-10-CM | POA: Diagnosis present

## 2022-10-29 DIAGNOSIS — M8700 Idiopathic aseptic necrosis of unspecified bone: Secondary | ICD-10-CM | POA: Diagnosis present

## 2022-10-29 NOTE — Progress Notes (Signed)
Per order,  Changed device settings for MRI to DOO at 90 bpm Will program device back to pre-MRI settings after completion of exam, and send transmission.

## 2023-02-07 ENCOUNTER — Other Ambulatory Visit (HOSPITAL_COMMUNITY): Payer: Self-pay | Admitting: Family Medicine

## 2023-02-07 DIAGNOSIS — M5412 Radiculopathy, cervical region: Secondary | ICD-10-CM

## 2023-03-25 ENCOUNTER — Ambulatory Visit (HOSPITAL_COMMUNITY)
Admission: RE | Admit: 2023-03-25 | Discharge: 2023-03-25 | Disposition: A | Discharge status: Home / Self Care | Payer: Medicare PPO | Source: Ambulatory Visit | Attending: Family Medicine | Admitting: Family Medicine

## 2023-03-25 DIAGNOSIS — M5412 Radiculopathy, cervical region: Secondary | ICD-10-CM | POA: Insufficient documentation

## 2023-03-25 NOTE — Progress Notes (Signed)
Per order, Changed Medtronic device settings for MRI to  DOO at 80 bpm  MRI mode  Will program device back to pre-MRI settings after completion of exam, and send transmission.

## 2023-04-25 ENCOUNTER — Ambulatory Visit (HOSPITAL_COMMUNITY): Payer: Medicare HMO

## 2023-07-20 ENCOUNTER — Ambulatory Visit: Payer: Medicare PPO | Admitting: Neurology

## 2023-08-02 ENCOUNTER — Ambulatory Visit: Payer: Medicare PPO | Admitting: Neurology

## 2024-04-16 IMAGING — MR MR LUMBAR SPINE WO/W CM
4 of 7 series · 18 of 48 positions shown · IV contrast (gadavist)
Comparison: 07/02/2021

CLINICAL DATA: Lumbar radiculopathy, posterior spinal fusion

EXAM:
MRI LUMBAR SPINE WITHOUT AND WITH CONTRAST
TECHNIQUE: Multiplanar and multiecho pulse sequences of the lumbar spine were
obtained without and with intravenous contrast.
CONTRAST:  9mL GADAVIST GADOBUTROL 1 MMOL/ML IV SOLN

[Series 3: T2 · sagittal · 4.5mm · 0.55mm/px · 3 of 14 slices shown (1 of 2)]
[im 1/14]
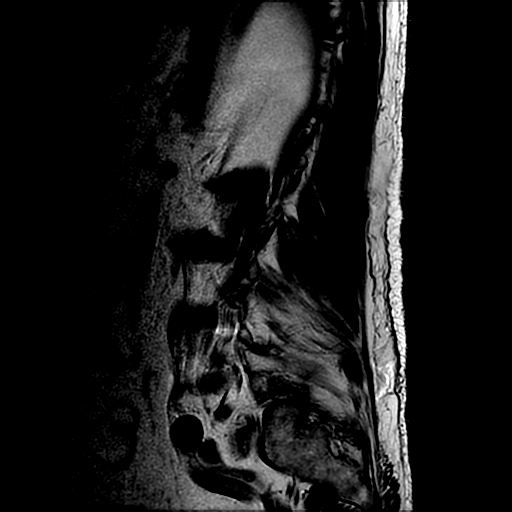
[im 7/14]
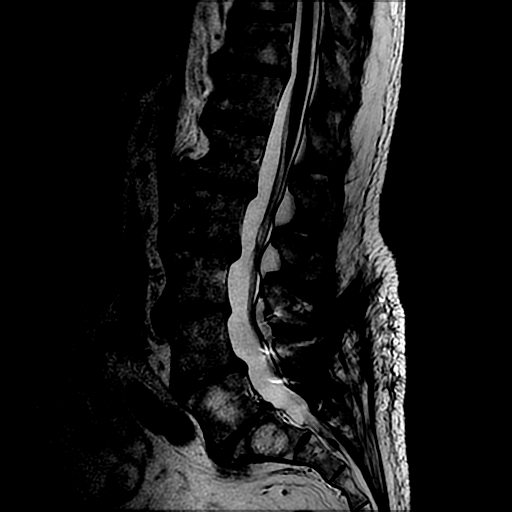
[im 14/14]
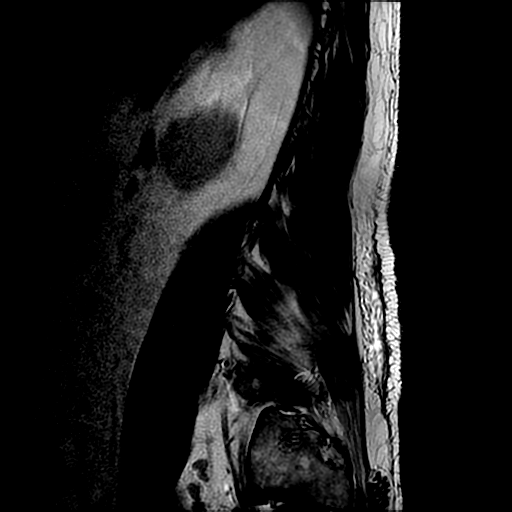

[Series 4: T1 · sagittal · 4.5mm · 0.55mm/px · 3 of 14 slices shown (1 of 2)]
[im 1/14]
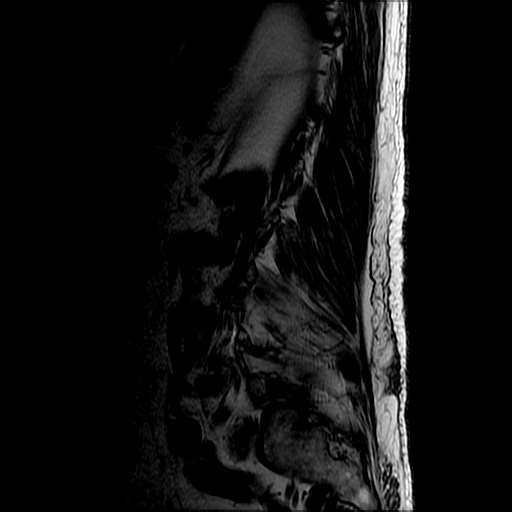
[im 9/14]
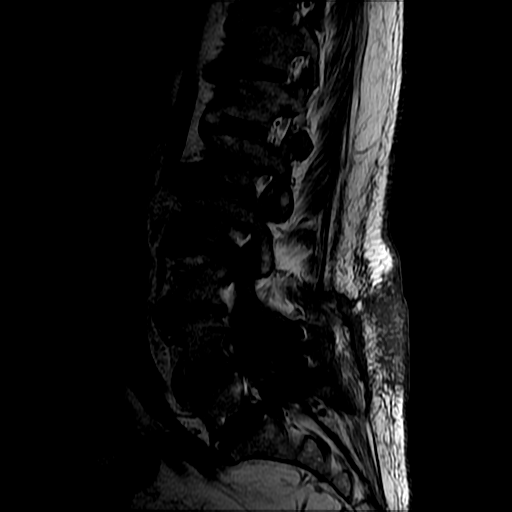
[im 14/14]
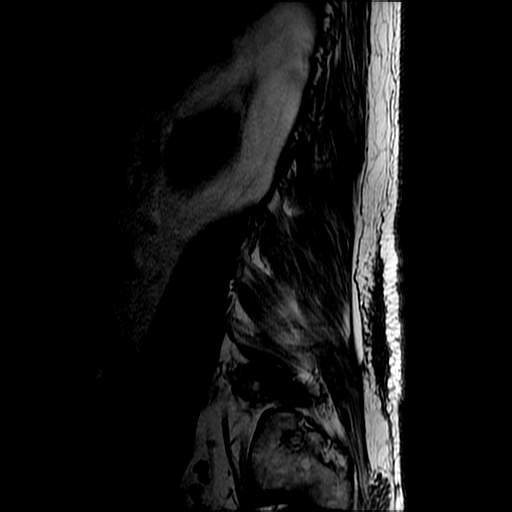

[Series 6: T2 · axial · 4.2mm · 0.39mm/px · z∈[-133,+56]mm · 9 of 37 slices shown (2 of 2)]
[im 1/37]
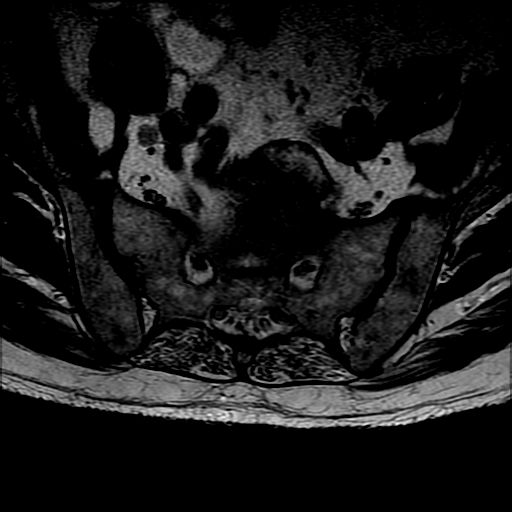
[im 4/37]
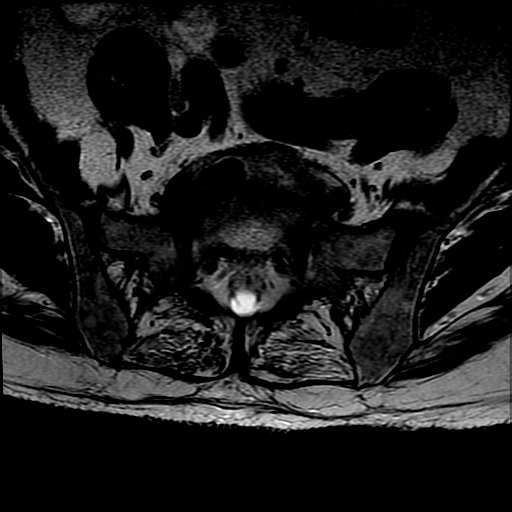
[im 8/37]
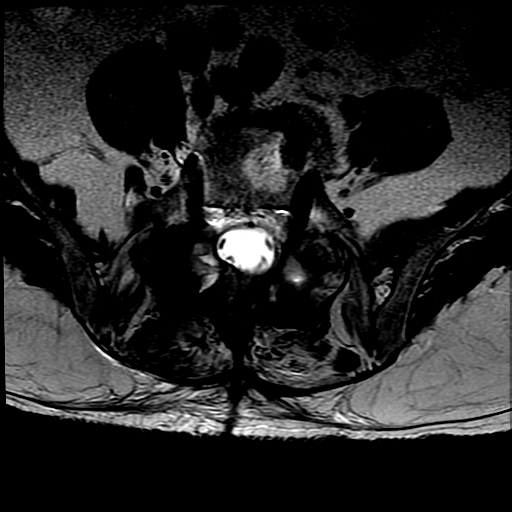
[im 11/37]
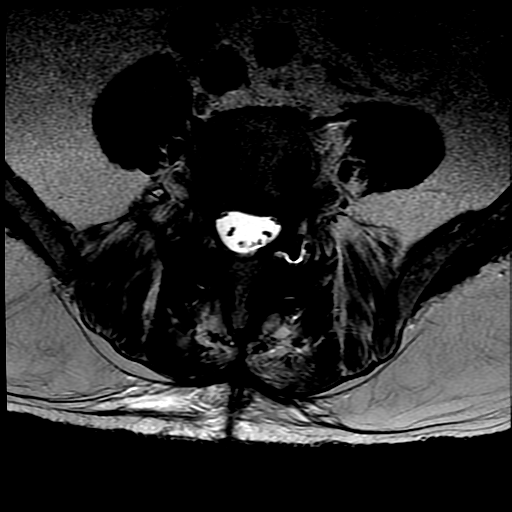
[im 15/37]
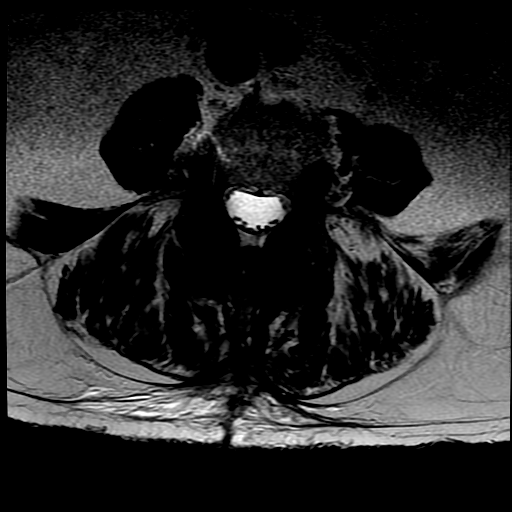
[im 19/37]
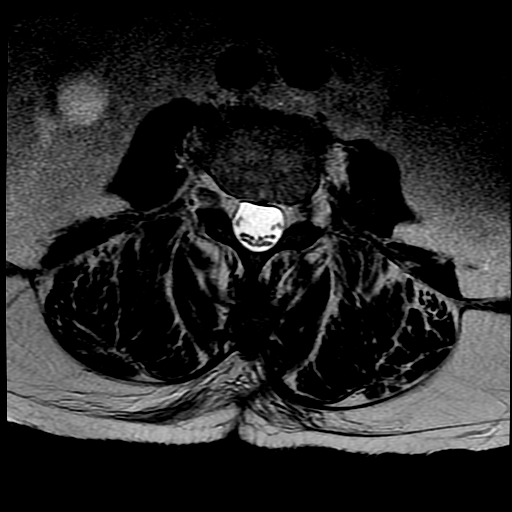
[im 22/37]
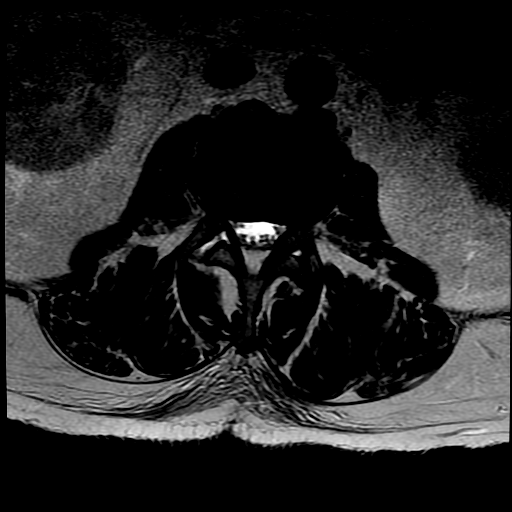
[im 26/37]
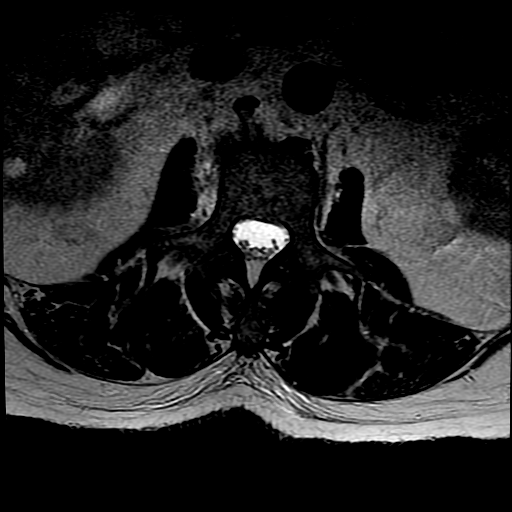
[im 33/37]
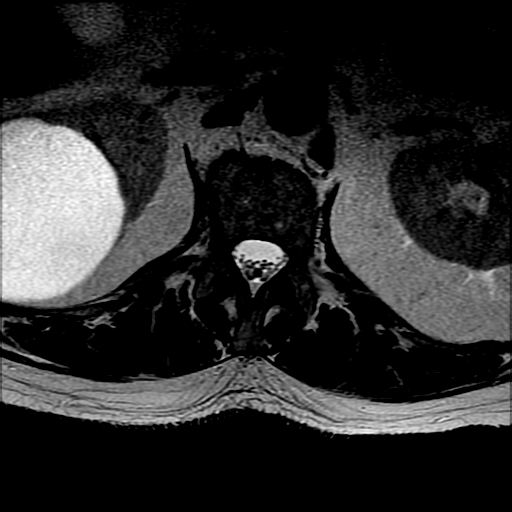

[Series 7: T1 · axial · 4.2mm · 0.39mm/px · z∈[-118,+56]mm · 3 of 37 slices shown (2 of 2)]
[im 4/37]
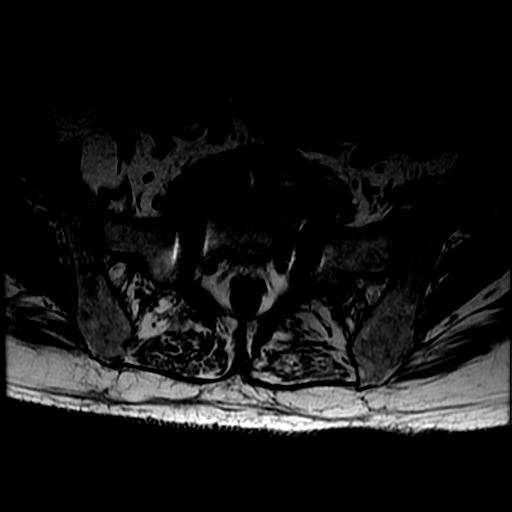
[im 19/37]
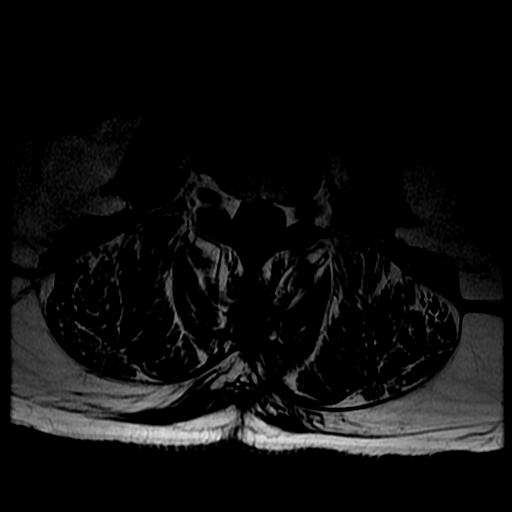
[im 33/37]
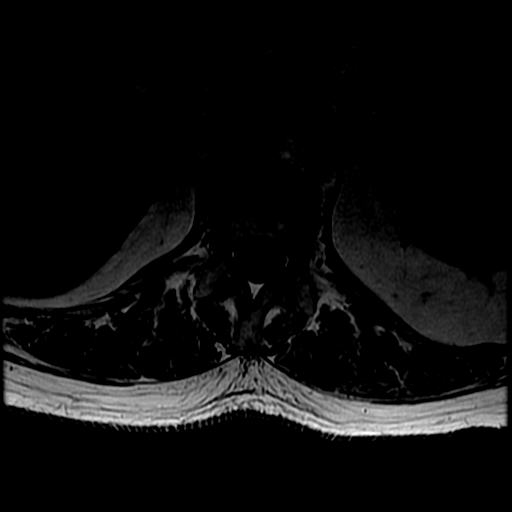

[18 of 48 positions shown; findings below may reference images not displayed]

FINDINGS: Segmentation:  Standard.

Alignment: Grade 1 anterolisthesis of scratch at 2 mm retrolisthesis
of L1 on L2, L2 on L3 and L3 on L4.

Vertebrae: No acute fracture, evidence of discitis, or aggressive
bone lesion. Hemangiomas in the L5 and S1 vertebral bodies.

Conus medullaris and cauda equina: Conus extends to the L1 level.
Conus and cauda equina appear normal.

Paraspinal and other soft tissues: No acute paraspinal abnormality.
Large right renal cyst partially visualized.

Disc levels:

Disc spaces: Posterior spinal fusion from L4 through S1.
Degenerative disease with disc height loss at L1-2, L2-3 and L3-4.

T12-L1: No significant disc bulge. No neural foraminal stenosis. No
central canal stenosis.

L1-L2: Mild broad-based disc bulge. No foraminal or central canal
stenosis.

L2-L3: Mild broad-based disc bulge. No foraminal or central canal
stenosis. Mild bilateral facet arthropathy. Bilateral mild
subarticular recess stenosis.

L3-L4: Mild broad-based disc bulge. Mild bilateral facet
arthropathy. No foraminal or central canal stenosis.

L4-L5: Posterior lumbar fusion. Mild broad-based disc bulge. Mild
bilateral foraminal stenosis. Spinal stenosis.

L5-S1: Posterior spinal fusion. Mild broad-based disc bulge.
Moderate left and mild right foraminal stenosis. No spinal stenosis.
IMPRESSION: 1. Posterior lumbar fusion from L4 through S1. Mild broad-based disc
bulge at L4-5 and L5-S1. Mild bilateral foraminal stenosis at L4-5.
Moderate left and mild right foraminal stenosis at L5-S1. No spinal
stenosis.
2. At L2-3 there is a mild broad-based disc bulge and mild bilateral
facet arthropathy. Bilateral mild subarticular recess stenosis.
